# Patient Record
Sex: Female | Born: 1935 | Race: Black or African American | Hispanic: No
Health system: Southern US, Community
[De-identification: ages and names within clinical notes are randomized; demographics above are authoritative.]

---

## 2005-10-15 ENCOUNTER — Ambulatory Visit: Payer: Self-pay | Admitting: Family Medicine

## 2005-11-26 ENCOUNTER — Inpatient Hospital Stay: Payer: Self-pay | Admitting: General Practice

## 2005-11-27 ENCOUNTER — Other Ambulatory Visit: Payer: Self-pay

## 2005-11-29 ENCOUNTER — Other Ambulatory Visit: Payer: Self-pay

## 2006-05-01 ENCOUNTER — Encounter: Payer: Self-pay | Admitting: General Practice

## 2006-09-11 ENCOUNTER — Ambulatory Visit: Payer: Self-pay | Admitting: Ophthalmology

## 2006-09-18 ENCOUNTER — Ambulatory Visit: Payer: Self-pay | Admitting: Ophthalmology

## 2006-11-14 ENCOUNTER — Ambulatory Visit: Payer: Self-pay | Admitting: Family Medicine

## 2007-11-17 ENCOUNTER — Ambulatory Visit: Payer: Self-pay | Admitting: Family Medicine

## 2007-11-26 ENCOUNTER — Other Ambulatory Visit: Payer: Self-pay

## 2007-11-26 ENCOUNTER — Emergency Department: Payer: Self-pay | Admitting: Internal Medicine

## 2007-11-29 ENCOUNTER — Other Ambulatory Visit: Payer: Self-pay

## 2007-11-29 ENCOUNTER — Emergency Department: Payer: Self-pay | Admitting: Emergency Medicine

## 2008-02-02 ENCOUNTER — Emergency Department: Payer: Self-pay | Admitting: Emergency Medicine

## 2008-02-02 ENCOUNTER — Other Ambulatory Visit: Payer: Self-pay

## 2008-06-14 ENCOUNTER — Inpatient Hospital Stay: Payer: Self-pay | Admitting: Internal Medicine

## 2008-11-29 ENCOUNTER — Ambulatory Visit: Payer: Self-pay | Admitting: Family Medicine

## 2008-12-16 ENCOUNTER — Emergency Department: Payer: Self-pay | Admitting: Emergency Medicine

## 2008-12-18 ENCOUNTER — Inpatient Hospital Stay: Payer: Self-pay | Admitting: Internal Medicine

## 2009-02-17 ENCOUNTER — Ambulatory Visit: Payer: Self-pay | Admitting: Ophthalmology

## 2009-03-02 ENCOUNTER — Ambulatory Visit: Payer: Self-pay | Admitting: Ophthalmology

## 2009-12-01 ENCOUNTER — Ambulatory Visit: Payer: Self-pay | Admitting: Family Medicine

## 2010-04-23 ENCOUNTER — Ambulatory Visit: Payer: Self-pay | Admitting: Internal Medicine

## 2010-10-02 ENCOUNTER — Inpatient Hospital Stay: Payer: Self-pay | Admitting: Internal Medicine

## 2010-11-23 ENCOUNTER — Emergency Department: Payer: Self-pay | Admitting: Emergency Medicine

## 2010-12-14 ENCOUNTER — Ambulatory Visit: Payer: Self-pay | Admitting: Family Medicine

## 2011-08-09 ENCOUNTER — Inpatient Hospital Stay: Payer: Self-pay | Admitting: Internal Medicine

## 2011-08-09 LAB — COMPREHENSIVE METABOLIC PANEL
Alkaline Phosphatase: 490 U/L — ABNORMAL HIGH (ref 50–136)
Bilirubin,Total: 19.9 mg/dL — ABNORMAL HIGH (ref 0.2–1.0)
Calcium, Total: 8.6 mg/dL (ref 8.5–10.1)
Chloride: 101 mmol/L (ref 98–107)
Co2: 24 mmol/L (ref 21–32)
Creatinine: 0.69 mg/dL (ref 0.60–1.30)
EGFR (Non-African Amer.): >60
Osmolality: 275 (ref 275–301)
SGPT (ALT): 42 U/L
Sodium: 138 mmol/L (ref 136–145)

## 2011-08-09 LAB — URINALYSIS, COMPLETE
Blood: NEGATIVE
Glucose,UR: NEGATIVE mg/dL (ref 0–75)
Hyaline Cast: 3
Leukocyte Esterase: NEGATIVE
Ph: 5 (ref 4.5–8.0)
Specific Gravity: 1.005 (ref 1.003–1.030)
Squamous Epithelial: 1

## 2011-08-09 LAB — CBC WITH DIFFERENTIAL/PLATELET
Bands: 6 %
Basophil: 1 %
Eosinophil: 1 %
HCT: 27.2 % — ABNORMAL LOW (ref 35.0–47.0)
HGB: 9.1 g/dL — ABNORMAL LOW (ref 12.0–16.0)
MCH: 27 pg (ref 26.0–34.0)
MCHC: 33.4 g/dL (ref 32.0–36.0)
MCV: 81 fL (ref 80–100)
Platelet: 243 10*3/uL (ref 150–440)
RBC: 3.37 10*6/uL — ABNORMAL LOW (ref 3.80–5.20)

## 2011-08-09 LAB — LIPASE, BLOOD: Lipase: 98 U/L (ref 73–393)

## 2011-08-09 LAB — LACTATE DEHYDROGENASE: LDH: 302 U/L — ABNORMAL HIGH (ref 84–246)

## 2011-08-10 ENCOUNTER — Ambulatory Visit: Payer: Self-pay

## 2011-08-10 LAB — COMPREHENSIVE METABOLIC PANEL
Albumin: 3.1 g/dL — ABNORMAL LOW (ref 3.4–5.0)
Alkaline Phosphatase: 399 U/L — ABNORMAL HIGH (ref 50–136)
Anion Gap: 12 (ref 7–16)
Bilirubin,Total: 21.7 mg/dL — ABNORMAL HIGH (ref 0.2–1.0)
Co2: 26 mmol/L (ref 21–32)
Creatinine: 0.78 mg/dL (ref 0.60–1.30)
EGFR (African American): >60
Potassium: 3.3 mmol/L — ABNORMAL LOW (ref 3.5–5.1)
SGOT(AST): 46 U/L — ABNORMAL HIGH (ref 15–37)
SGPT (ALT): 36 U/L
Total Protein: 6.9 g/dL (ref 6.4–8.2)

## 2011-08-10 LAB — CBC WITH DIFFERENTIAL/PLATELET
Bands: 3 %
Bands: 1 %
Basophil: 1 %
Comment - H1-Com3: NORMAL
Eosinophil: 9 %
Eosinophil: 3 %
HGB: 8.1 g/dL — ABNORMAL LOW (ref 12.0–16.0)
HGB: 8.8 g/dL — ABNORMAL LOW (ref 12.0–16.0)
Lymphocytes: 11 %
MCH: 27.4 pg (ref 26.0–34.0)
MCH: 27.6 pg (ref 26.0–34.0)
MCHC: 33.9 g/dL (ref 32.0–36.0)
MCV: 82 fL (ref 80–100)
Monocytes: 12 %
Platelet: 211 10*3/uL (ref 150–440)
RBC: 2.97 10*6/uL — ABNORMAL LOW (ref 3.80–5.20)
RBC: 3.18 10*6/uL — ABNORMAL LOW (ref 3.80–5.20)
RDW: 21.2 % — ABNORMAL HIGH (ref 11.5–14.5)

## 2011-08-10 LAB — BASIC METABOLIC PANEL
Anion Gap: 15 (ref 7–16)
BUN: 18 mg/dL (ref 7–18)
Co2: 22 mmol/L (ref 21–32)
Creatinine: 0.77 mg/dL (ref 0.60–1.30)
EGFR (Non-African Amer.): >60
Glucose: 172 mg/dL — ABNORMAL HIGH (ref 65–99)
Osmolality: 284 (ref 275–301)
Potassium: 3.5 mmol/L (ref 3.5–5.1)
Sodium: 139 mmol/L (ref 136–145)

## 2011-08-10 LAB — APTT: Activated PTT: 38.5 secs — ABNORMAL HIGH (ref 23.6–35.9)

## 2011-08-10 LAB — FIBRINOGEN: Fibrinogen: 558 mg/dL — ABNORMAL HIGH (ref 210–470)

## 2011-08-10 LAB — PROTIME-INR: INR: 1.3

## 2011-08-11 LAB — CBC WITH DIFFERENTIAL/PLATELET
HCT: 25.8 % — ABNORMAL LOW (ref 35.0–47.0)
MCH: 27.2 pg (ref 26.0–34.0)
MCV: 82 fL (ref 80–100)
Platelet: 212 10*3/uL (ref 150–440)
RDW: 21 % — ABNORMAL HIGH (ref 11.5–14.5)
WBC: 10 10*3/uL (ref 3.6–11.0)

## 2011-08-11 LAB — PROTIME-INR
INR: 1.2
Prothrombin Time: 15.3 secs — ABNORMAL HIGH (ref 11.5–14.7)

## 2011-08-11 LAB — LIPASE, BLOOD: Lipase: 120 U/L (ref 73–393)

## 2011-08-11 LAB — BASIC METABOLIC PANEL
Calcium, Total: 8.7 mg/dL (ref 8.5–10.1)
Co2: 20 mmol/L — ABNORMAL LOW (ref 21–32)
Osmolality: 290 (ref 275–301)
Potassium: 3.8 mmol/L (ref 3.5–5.1)
Sodium: 141 mmol/L (ref 136–145)

## 2011-08-11 LAB — APTT: Activated PTT: 29.7 secs (ref 23.6–35.9)

## 2011-08-11 LAB — HEPATIC FUNCTION PANEL A (ARMC)
Bilirubin, Direct: 21.3 mg/dL — ABNORMAL HIGH (ref 0.00–0.20)
SGPT (ALT): 36 U/L

## 2011-08-12 LAB — COMPREHENSIVE METABOLIC PANEL
Anion Gap: 13 (ref 7–16)
BUN: 30 mg/dL — ABNORMAL HIGH (ref 7–18)
Bilirubin,Total: 27.6 mg/dL — ABNORMAL HIGH (ref 0.2–1.0)
Calcium, Total: 9.3 mg/dL (ref 8.5–10.1)
Chloride: 100 mmol/L (ref 98–107)
Co2: 24 mmol/L (ref 21–32)
Creatinine: 0.95 mg/dL (ref 0.60–1.30)
EGFR (African American): >60
EGFR (Non-African Amer.): >60
Osmolality: 298 (ref 275–301)
Potassium: 3.8 mmol/L (ref 3.5–5.1)
SGPT (ALT): 40 U/L
Total Protein: 7.1 g/dL (ref 6.4–8.2)

## 2011-08-12 LAB — CBC WITH DIFFERENTIAL/PLATELET
HCT: 26.3 % — ABNORMAL LOW (ref 35.0–47.0)
HGB: 8.9 g/dL — ABNORMAL LOW (ref 12.0–16.0)
Lymphocytes: 12 %
MCHC: 33.8 g/dL (ref 32.0–36.0)
MCV: 82 fL (ref 80–100)
Platelet: 239 10*3/uL (ref 150–440)
Segmented Neutrophils: 75 %
WBC: 9.2 10*3/uL (ref 3.6–11.0)

## 2011-08-12 LAB — PROTIME-INR: Prothrombin Time: 14.5 secs (ref 11.5–14.7)

## 2011-08-12 LAB — APTT: Activated PTT: 30.3 secs (ref 23.6–35.9)

## 2011-08-13 LAB — CBC WITH DIFFERENTIAL/PLATELET
HCT: 27.4 % — ABNORMAL LOW (ref 35.0–47.0)
Lymphocytes: 7 %
MCH: 27.6 pg (ref 26.0–34.0)
MCV: 83 fL (ref 80–100)
Monocytes: 13 %
Platelet: 253 10*3/uL (ref 150–440)
RDW: 21.6 % — ABNORMAL HIGH (ref 11.5–14.5)

## 2011-08-13 LAB — COMPREHENSIVE METABOLIC PANEL
Albumin: 2.9 g/dL — ABNORMAL LOW (ref 3.4–5.0)
Alkaline Phosphatase: 483 U/L — ABNORMAL HIGH (ref 50–136)
Bilirubin,Total: 28.2 mg/dL — ABNORMAL HIGH (ref 0.2–1.0)
Chloride: 100 mmol/L (ref 98–107)
Co2: 25 mmol/L (ref 21–32)
Creatinine: 0.71 mg/dL (ref 0.60–1.30)
EGFR (African American): >60
EGFR (Non-African Amer.): >60
Glucose: 282 mg/dL — ABNORMAL HIGH (ref 65–99)
Osmolality: 293 (ref 275–301)
SGOT(AST): 38 U/L — ABNORMAL HIGH (ref 15–37)
SGPT (ALT): 39 U/L
Sodium: 139 mmol/L (ref 136–145)

## 2011-08-14 LAB — CBC WITH DIFFERENTIAL/PLATELET
HCT: 27.1 % — ABNORMAL LOW (ref 35.0–47.0)
Lymphocytes: 5 %
MCH: 27.2 pg (ref 26.0–34.0)
MCV: 83 fL (ref 80–100)
Monocytes: 9 %
RBC: 3.27 10*6/uL — ABNORMAL LOW (ref 3.80–5.20)
WBC: 9.5 10*3/uL (ref 3.6–11.0)

## 2011-08-14 LAB — COMPREHENSIVE METABOLIC PANEL
Alkaline Phosphatase: 485 U/L — ABNORMAL HIGH (ref 50–136)
Anion Gap: 12 (ref 7–16)
Bilirubin,Total: 23.3 mg/dL — ABNORMAL HIGH (ref 0.2–1.0)
Co2: 28 mmol/L (ref 21–32)
Creatinine: 0.73 mg/dL (ref 0.60–1.30)
EGFR (African American): >60
EGFR (Non-African Amer.): >60
Glucose: 298 mg/dL — ABNORMAL HIGH (ref 65–99)
Potassium: 4.2 mmol/L (ref 3.5–5.1)
SGPT (ALT): 40 U/L
Sodium: 142 mmol/L (ref 136–145)

## 2011-08-14 LAB — LIPASE, BLOOD: Lipase: 92 U/L (ref 73–393)

## 2011-08-15 LAB — COMPREHENSIVE METABOLIC PANEL
Albumin: 2.7 g/dL — ABNORMAL LOW (ref 3.4–5.0)
Anion Gap: 11 (ref 7–16)
BUN: 18 mg/dL (ref 7–18)
Bilirubin,Total: 14.4 mg/dL — ABNORMAL HIGH (ref 0.2–1.0)
Calcium, Total: 8.8 mg/dL (ref 8.5–10.1)
Chloride: 101 mmol/L (ref 98–107)
Co2: 28 mmol/L (ref 21–32)
EGFR (African American): >60
EGFR (Non-African Amer.): >60
Potassium: 3.8 mmol/L (ref 3.5–5.1)
SGOT(AST): 19 U/L (ref 15–37)
SGPT (ALT): 35 U/L
Total Protein: 6.6 g/dL (ref 6.4–8.2)

## 2011-08-16 LAB — HEPATIC FUNCTION PANEL A (ARMC)
Albumin: 2.5 g/dL — ABNORMAL LOW (ref 3.4–5.0)
Alkaline Phosphatase: 324 U/L — ABNORMAL HIGH (ref 50–136)
Bilirubin, Direct: 8.5 mg/dL — ABNORMAL HIGH (ref 0.00–0.20)
Bilirubin,Total: 10.2 mg/dL — ABNORMAL HIGH (ref 0.2–1.0)

## 2011-08-20 ENCOUNTER — Telehealth: Payer: Self-pay

## 2011-08-20 DIAGNOSIS — K831 Obstruction of bile duct: Secondary | ICD-10-CM

## 2011-08-20 NOTE — Telephone Encounter (Signed)
I spoke with the pt who was having a hard time understanding the instructions and she asked me to speak with her niece.  I went over the procedure and the instructions with her niece and reviewed her medications.  I also mailed the instructions and she will call with any questions or concerns.

## 2011-08-23 ENCOUNTER — Telehealth: Payer: Self-pay

## 2011-08-23 NOTE — Telephone Encounter (Signed)
Response received from Rudi Coco NP that the pt has been off of her coumadin since 08/16/11.  The pt has opted to remain off of the coumadin despite the stroke risk.  Information will be scanned into EPIC.

## 2011-09-06 ENCOUNTER — Ambulatory Visit: Payer: Self-pay

## 2011-09-06 ENCOUNTER — Encounter: Payer: Self-pay | Admitting: Gastroenterology

## 2011-09-10 ENCOUNTER — Telehealth: Payer: Self-pay | Admitting: *Deleted

## 2011-09-10 ENCOUNTER — Telehealth: Payer: Self-pay | Admitting: Gastroenterology

## 2011-09-10 LAB — PATHOLOGY REPORT

## 2011-09-10 NOTE — Telephone Encounter (Signed)
Dr Jennifer Egan Pathologist with [facility] called to give results of EUS: Pancreatic Head Mass with Pancreatic Ductal Adenocarcinoma with insufficient material in cell block for further studies.  

## 2011-09-10 NOTE — Telephone Encounter (Signed)
Pt's niece was informed that the results are not available and we will call as soon as the results are reviewed

## 2011-09-11 NOTE — Telephone Encounter (Signed)
I spoke with Dr Brodie she reviewed the path results and feels like it is best to let Dr Jacobs take care of the pt on Monday when he is back in town.  I will put the results on Dr jacobs desk. 

## 2011-09-11 NOTE — Telephone Encounter (Signed)
Pathology results received I will give to Dr Juanda Chance to review (doc of the day)

## 2011-09-14 NOTE — Telephone Encounter (Signed)
On your desk; Patty got them. 

## 2011-09-14 NOTE — Telephone Encounter (Signed)
Can you make sure that [facility] sends a hard copy of the final path to me and put on my desk.  Thanks.

## 2011-09-17 ENCOUNTER — Telehealth: Payer: Self-pay | Admitting: Gastroenterology

## 2011-09-17 NOTE — Telephone Encounter (Signed)
i spoke with pt and her niece Britta Mccreedy this AM.  She is aware of biopsy results and has appt at [facility] this Thursday already set up.

## 2011-09-19 ENCOUNTER — Encounter: Payer: Self-pay | Admitting: Gastroenterology

## 2011-09-20 ENCOUNTER — Ambulatory Visit: Payer: Self-pay | Admitting: Oncology

## 2011-09-20 ENCOUNTER — Inpatient Hospital Stay: Payer: Self-pay | Admitting: Oncology

## 2011-09-20 LAB — CBC WITH DIFFERENTIAL/PLATELET
Basophil #: 0 10*3/uL (ref 0.0–0.1)
Eosinophil #: 0.1 10*3/uL (ref 0.0–0.7)
Eosinophil %: 1 %
HCT: 27.9 % — ABNORMAL LOW (ref 35.0–47.0)
HGB: 8.6 g/dL — ABNORMAL LOW (ref 12.0–16.0)
Lymphocyte #: 0.2 10*3/uL — ABNORMAL LOW (ref 1.0–3.6)
Monocyte #: 1.8 x10 3/mm — ABNORMAL HIGH (ref 0.2–0.9)
Neutrophil #: 8 10*3/uL — ABNORMAL HIGH (ref 1.4–6.5)
Neutrophil %: 78.3 %
Platelet: 286 10*3/uL (ref 150–440)
RBC: 3.28 10*6/uL — ABNORMAL LOW (ref 3.80–5.20)
RDW: 20.2 % — ABNORMAL HIGH (ref 11.5–14.5)
WBC: 10.2 10*3/uL (ref 3.6–11.0)

## 2011-09-20 LAB — COMPREHENSIVE METABOLIC PANEL
Albumin: 3.1 g/dL — ABNORMAL LOW (ref 3.4–5.0)
Anion Gap: 8 (ref 7–16)
BUN: 14 mg/dL (ref 7–18)
Bilirubin,Total: 6.5 mg/dL — ABNORMAL HIGH (ref 0.2–1.0)
Calcium, Total: 9 mg/dL (ref 8.5–10.1)
Chloride: 96 mmol/L — ABNORMAL LOW (ref 98–107)
Creatinine: 0.94 mg/dL (ref 0.60–1.30)
EGFR (African American): >60
EGFR (Non-African Amer.): 59 — ABNORMAL LOW
Glucose: 254 mg/dL — ABNORMAL HIGH (ref 65–99)
Osmolality: 274 (ref 275–301)
Potassium: 4.1 mmol/L (ref 3.5–5.1)
SGOT(AST): 52 U/L — ABNORMAL HIGH (ref 15–37)
SGPT (ALT): 38 U/L
Total Protein: 7.4 g/dL (ref 6.4–8.2)

## 2011-09-21 LAB — URINALYSIS, COMPLETE
Glucose,UR: NEGATIVE mg/dL (ref 0–75)
Ketone: NEGATIVE
Specific Gravity: 1.013 (ref 1.003–1.030)
WBC UR: 3746 /HPF (ref 0–5)

## 2011-09-22 LAB — COMPREHENSIVE METABOLIC PANEL
Anion Gap: 12 (ref 7–16)
Bilirubin,Total: 9.8 mg/dL — ABNORMAL HIGH (ref 0.2–1.0)
Calcium, Total: 8.6 mg/dL (ref 8.5–10.1)
Chloride: 98 mmol/L (ref 98–107)
EGFR (African American): >60
EGFR (Non-African Amer.): >60
Glucose: 240 mg/dL — ABNORMAL HIGH (ref 65–99)
Potassium: 3.9 mmol/L (ref 3.5–5.1)
SGOT(AST): 46 U/L — ABNORMAL HIGH (ref 15–37)
SGPT (ALT): 42 U/L
Sodium: 134 mmol/L — ABNORMAL LOW (ref 136–145)
Total Protein: 7 g/dL (ref 6.4–8.2)

## 2011-09-22 LAB — CBC WITH DIFFERENTIAL/PLATELET
Basophil #: 0 10*3/uL (ref 0.0–0.1)
Eosinophil %: 0.7 %
HCT: 26.7 % — ABNORMAL LOW (ref 35.0–47.0)
HGB: 8.6 g/dL — ABNORMAL LOW (ref 12.0–16.0)
Lymphocyte %: 3.7 %
MCH: 27 pg (ref 26.0–34.0)
MCHC: 32.1 g/dL (ref 32.0–36.0)
Monocyte #: 1 x10 3/mm — ABNORMAL HIGH (ref 0.2–0.9)
Monocyte %: 11.9 %
Neutrophil #: 6.8 10*3/uL — ABNORMAL HIGH (ref 1.4–6.5)
Neutrophil %: 83.3 %
Platelet: 292 10*3/uL (ref 150–440)
RDW: 19.8 % — ABNORMAL HIGH (ref 11.5–14.5)
WBC: 8.2 10*3/uL (ref 3.6–11.0)

## 2011-09-23 LAB — HEPATIC FUNCTION PANEL A (ARMC)
Albumin: 2.6 g/dL — ABNORMAL LOW (ref 3.4–5.0)
Bilirubin,Total: 12.2 mg/dL — ABNORMAL HIGH (ref 0.2–1.0)
SGOT(AST): 37 U/L (ref 15–37)
SGPT (ALT): 42 U/L
Total Protein: 7.3 g/dL (ref 6.4–8.2)

## 2011-09-24 LAB — POTASSIUM: Potassium: 4.2 mmol/L (ref 3.5–5.1)

## 2011-09-24 LAB — CBC WITH DIFFERENTIAL/PLATELET
Basophil: 1 %
HCT: 26 % — ABNORMAL LOW (ref 35.0–47.0)
Lymphocytes: 10 %
MCHC: 31.2 g/dL — ABNORMAL LOW (ref 32.0–36.0)
Monocytes: 7 %
Platelet: 274 10*3/uL (ref 150–440)
RBC: 3.1 10*6/uL — ABNORMAL LOW (ref 3.80–5.20)
Segmented Neutrophils: 75 %

## 2011-09-24 LAB — CREATININE, SERUM
Creatinine: 0.75 mg/dL (ref 0.60–1.30)
EGFR (Non-African Amer.): >60

## 2011-09-25 LAB — COMPREHENSIVE METABOLIC PANEL
Albumin: 2.3 g/dL — ABNORMAL LOW (ref 3.4–5.0)
Alkaline Phosphatase: 491 U/L — ABNORMAL HIGH (ref 50–136)
Anion Gap: 11 (ref 7–16)
BUN: 19 mg/dL — ABNORMAL HIGH (ref 7–18)
EGFR (African American): >60
EGFR (Non-African Amer.): >60
Osmolality: 276 (ref 275–301)
Sodium: 132 mmol/L — ABNORMAL LOW (ref 136–145)
Total Protein: 6.7 g/dL (ref 6.4–8.2)

## 2011-09-25 LAB — CBC WITH DIFFERENTIAL/PLATELET
Bands: 2 %
Comment - H1-Com3: NORMAL
Lymphocytes: 11 %
MCV: 84 fL (ref 80–100)
Monocytes: 6 %
Platelet: 267 10*3/uL (ref 150–440)
RBC: 3.04 10*6/uL — ABNORMAL LOW (ref 3.80–5.20)
RDW: 19.4 % — ABNORMAL HIGH (ref 11.5–14.5)
Segmented Neutrophils: 77 %

## 2011-09-26 LAB — COMPREHENSIVE METABOLIC PANEL
Anion Gap: 12 (ref 7–16)
BUN: 26 mg/dL — ABNORMAL HIGH (ref 7–18)
Bilirubin,Total: 5.8 mg/dL — ABNORMAL HIGH (ref 0.2–1.0)
Calcium, Total: 8.4 mg/dL — ABNORMAL LOW (ref 8.5–10.1)
Creatinine: 1.23 mg/dL (ref 0.60–1.30)
EGFR (African American): 50 — ABNORMAL LOW
EGFR (Non-African Amer.): 43 — ABNORMAL LOW
Glucose: 270 mg/dL — ABNORMAL HIGH (ref 65–99)
SGPT (ALT): 26 U/L
Total Protein: 6.8 g/dL (ref 6.4–8.2)

## 2011-09-26 LAB — CULTURE, BLOOD (SINGLE)

## 2011-09-26 LAB — CBC WITH DIFFERENTIAL/PLATELET
Basophil #: 0.2 10*3/uL — ABNORMAL HIGH (ref 0.0–0.1)
Basophil %: 1.7 %
Eosinophil #: 0.5 10*3/uL (ref 0.0–0.7)
HCT: 25.5 % — ABNORMAL LOW (ref 35.0–47.0)
HGB: 8 g/dL — ABNORMAL LOW (ref 12.0–16.0)
Lymphocyte %: 8.6 %
Monocyte #: 1.5 x10 3/mm — ABNORMAL HIGH (ref 0.2–0.9)
Monocyte %: 15 %
Neutrophil #: 6.8 10*3/uL — ABNORMAL HIGH (ref 1.4–6.5)
RBC: 3.04 10*6/uL — ABNORMAL LOW (ref 3.80–5.20)
WBC: 9.8 10*3/uL (ref 3.6–11.0)

## 2011-10-08 ENCOUNTER — Ambulatory Visit: Payer: Self-pay | Admitting: Vascular Surgery

## 2011-10-10 ENCOUNTER — Ambulatory Visit: Payer: Self-pay | Admitting: Oncology

## 2011-10-22 LAB — CBC CANCER CENTER
Basophil %: 1.2 %
Eosinophil %: 5.1 %
HCT: 28.6 % — ABNORMAL LOW (ref 35.0–47.0)
HGB: 8.9 g/dL — ABNORMAL LOW (ref 12.0–16.0)
Lymphocyte %: 11 %
MCH: 26.2 pg (ref 26.0–34.0)
MCHC: 31.3 g/dL — ABNORMAL LOW (ref 32.0–36.0)
MCV: 84 fL (ref 80–100)
Monocyte #: 0.8 x10 3/mm (ref 0.2–0.9)
Neutrophil #: 4.8 x10 3/mm (ref 1.4–6.5)
Neutrophil %: 70.4 %
Platelet: 236 x10 3/mm (ref 150–440)
RBC: 3.4 10*6/uL — ABNORMAL LOW (ref 3.80–5.20)
RDW: 19.5 % — ABNORMAL HIGH (ref 11.5–14.5)
WBC: 6.9 x10 3/mm (ref 3.6–11.0)

## 2011-10-22 LAB — COMPREHENSIVE METABOLIC PANEL
Alkaline Phosphatase: 257 U/L — ABNORMAL HIGH (ref 50–136)
BUN: 7 mg/dL (ref 7–18)
Bilirubin,Total: 2.2 mg/dL — ABNORMAL HIGH (ref 0.2–1.0)
Chloride: 99 mmol/L (ref 98–107)
Co2: 30 mmol/L (ref 21–32)
Creatinine: 0.81 mg/dL (ref 0.60–1.30)
EGFR (African American): >60
EGFR (Non-African Amer.): >60
Osmolality: 274 (ref 275–301)
Sodium: 138 mmol/L (ref 136–145)

## 2011-10-29 LAB — CBC CANCER CENTER
Basophil #: 0.1 x10 3/mm (ref 0.0–0.1)
Basophil %: 1 %
Eosinophil %: 5.7 %
Lymphocyte #: 0.6 x10 3/mm — ABNORMAL LOW (ref 1.0–3.6)
MCH: 25.9 pg — ABNORMAL LOW (ref 26.0–34.0)
MCHC: 31 g/dL — ABNORMAL LOW (ref 32.0–36.0)
Monocyte #: 0.7 x10 3/mm (ref 0.2–0.9)
Neutrophil %: 69.8 %
Platelet: 251 x10 3/mm (ref 150–440)
RBC: 3.34 10*6/uL — ABNORMAL LOW (ref 3.80–5.20)
RDW: 19.5 % — ABNORMAL HIGH (ref 11.5–14.5)
WBC: 5.5 x10 3/mm (ref 3.6–11.0)

## 2011-10-29 LAB — IRON AND TIBC
Iron Saturation: 24 %
Iron: 73 ug/dL (ref 50–170)

## 2011-10-29 LAB — HEPATIC FUNCTION PANEL A (ARMC)
Alkaline Phosphatase: 205 U/L — ABNORMAL HIGH (ref 50–136)
Bilirubin, Direct: 1.5 mg/dL — ABNORMAL HIGH (ref 0.00–0.20)
Bilirubin,Total: 1.8 mg/dL — ABNORMAL HIGH (ref 0.2–1.0)
Total Protein: 7.4 g/dL (ref 6.4–8.2)

## 2011-10-29 LAB — BASIC METABOLIC PANEL
Anion Gap: 11 (ref 7–16)
Chloride: 101 mmol/L (ref 98–107)
Glucose: 115 mg/dL — ABNORMAL HIGH (ref 65–99)
Osmolality: 280 (ref 275–301)
Sodium: 140 mmol/L (ref 136–145)

## 2011-11-06 LAB — HEPATIC FUNCTION PANEL A (ARMC)
Albumin: 3.2 g/dL — ABNORMAL LOW (ref 3.4–5.0)
Alkaline Phosphatase: 183 U/L — ABNORMAL HIGH (ref 50–136)
Bilirubin, Direct: 1.2 mg/dL — ABNORMAL HIGH (ref 0.00–0.20)
Bilirubin,Total: 1.6 mg/dL — ABNORMAL HIGH (ref 0.2–1.0)
SGOT(AST): 14 U/L — ABNORMAL LOW (ref 15–37)

## 2011-11-06 LAB — CBC CANCER CENTER
Basophil %: 0.9 %
Eosinophil #: 0.3 x10 3/mm (ref 0.0–0.7)
Eosinophil %: 5.3 %
HCT: 27.4 % — ABNORMAL LOW (ref 35.0–47.0)
Lymphocyte %: 7.3 %
MCH: 26.1 pg (ref 26.0–34.0)
MCHC: 31.2 g/dL — ABNORMAL LOW (ref 32.0–36.0)
MCV: 84 fL (ref 80–100)
Monocyte #: 0.6 x10 3/mm (ref 0.2–0.9)
Monocyte %: 12 %
Neutrophil %: 74.5 %
RBC: 3.27 10*6/uL — ABNORMAL LOW (ref 3.80–5.20)
WBC: 5 x10 3/mm (ref 3.6–11.0)

## 2011-11-06 LAB — BASIC METABOLIC PANEL
Anion Gap: 12 (ref 7–16)
BUN: 9 mg/dL (ref 7–18)
Calcium, Total: 8.5 mg/dL (ref 8.5–10.1)
Co2: 26 mmol/L (ref 21–32)
Creatinine: 0.72 mg/dL (ref 0.60–1.30)
EGFR (Non-African Amer.): >60
Glucose: 217 mg/dL — ABNORMAL HIGH (ref 65–99)
Osmolality: 279 (ref 275–301)
Potassium: 3.7 mmol/L (ref 3.5–5.1)
Sodium: 137 mmol/L (ref 136–145)

## 2011-11-10 ENCOUNTER — Ambulatory Visit: Payer: Self-pay | Admitting: Oncology

## 2011-11-12 LAB — HEPATIC FUNCTION PANEL A (ARMC)
Albumin: 3.3 g/dL — ABNORMAL LOW (ref 3.4–5.0)
Bilirubin, Direct: 1 mg/dL — ABNORMAL HIGH (ref 0.00–0.20)
SGOT(AST): 16 U/L (ref 15–37)
SGPT (ALT): 15 U/L
Total Protein: 7.3 g/dL (ref 6.4–8.2)

## 2011-11-12 LAB — BASIC METABOLIC PANEL
Anion Gap: 8 (ref 7–16)
Calcium, Total: 8.6 mg/dL (ref 8.5–10.1)
Chloride: 99 mmol/L (ref 98–107)
Creatinine: 0.66 mg/dL (ref 0.60–1.30)
EGFR (African American): >60
Glucose: 136 mg/dL — ABNORMAL HIGH (ref 65–99)
Osmolality: 273 (ref 275–301)
Potassium: 3.6 mmol/L (ref 3.5–5.1)
Sodium: 136 mmol/L (ref 136–145)

## 2011-11-12 LAB — CBC CANCER CENTER
Eosinophil #: 0.2 x10 3/mm (ref 0.0–0.7)
Lymphocyte #: 0.6 x10 3/mm — ABNORMAL LOW (ref 1.0–3.6)
MCH: 26.3 pg (ref 26.0–34.0)
MCHC: 31.6 g/dL — ABNORMAL LOW (ref 32.0–36.0)
Monocyte #: 0.9 x10 3/mm (ref 0.2–0.9)
Neutrophil #: 2.6 x10 3/mm (ref 1.4–6.5)
Neutrophil %: 58.5 %
Platelet: 176 x10 3/mm (ref 150–440)
RDW: 19.9 % — ABNORMAL HIGH (ref 11.5–14.5)
WBC: 4.4 x10 3/mm (ref 3.6–11.0)

## 2011-11-19 LAB — BASIC METABOLIC PANEL
Anion Gap: 13 (ref 7–16)
BUN: 9 mg/dL (ref 7–18)
Calcium, Total: 8.4 mg/dL — ABNORMAL LOW (ref 8.5–10.1)
Chloride: 97 mmol/L — ABNORMAL LOW (ref 98–107)
Creatinine: 0.71 mg/dL (ref 0.60–1.30)
EGFR (African American): >60
EGFR (Non-African Amer.): >60
Glucose: 229 mg/dL — ABNORMAL HIGH (ref 65–99)
Osmolality: 283 (ref 275–301)
Sodium: 139 mmol/L (ref 136–145)

## 2011-11-19 LAB — CBC CANCER CENTER
Basophil %: 1.1 %
Eosinophil #: 0.3 x10 3/mm (ref 0.0–0.7)
Eosinophil %: 5.4 %
HCT: 26.4 % — ABNORMAL LOW (ref 35.0–47.0)
Lymphocyte #: 0.5 x10 3/mm — ABNORMAL LOW (ref 1.0–3.6)
Lymphocyte %: 10.1 %
MCH: 26.3 pg (ref 26.0–34.0)
Monocyte #: 0.9 x10 3/mm (ref 0.2–0.9)
Monocyte %: 19 %
Neutrophil #: 3.1 x10 3/mm (ref 1.4–6.5)
Neutrophil %: 64.4 %
RBC: 3.16 10*6/uL — ABNORMAL LOW (ref 3.80–5.20)

## 2011-11-19 LAB — HEPATIC FUNCTION PANEL A (ARMC)
Albumin: 3.2 g/dL — ABNORMAL LOW (ref 3.4–5.0)
Bilirubin, Direct: 1 mg/dL — ABNORMAL HIGH (ref 0.00–0.20)
SGOT(AST): 22 U/L (ref 15–37)
SGPT (ALT): 39 U/L

## 2011-11-26 LAB — CBC CANCER CENTER
Basophil %: 0.2 %
Eosinophil #: 0 x10 3/mm (ref 0.0–0.7)
MCH: 26.6 pg (ref 26.0–34.0)
Monocyte %: 15.4 %
Neutrophil #: 6.3 x10 3/mm (ref 1.4–6.5)
Neutrophil %: 80.3 %
Platelet: 220 x10 3/mm (ref 150–440)
RBC: 3.38 10*6/uL — ABNORMAL LOW (ref 3.80–5.20)
RDW: 21.3 % — ABNORMAL HIGH (ref 11.5–14.5)
WBC: 7.9 x10 3/mm (ref 3.6–11.0)

## 2011-12-03 LAB — COMPREHENSIVE METABOLIC PANEL
Anion Gap: 6 — ABNORMAL LOW (ref 7–16)
BUN: 11 mg/dL (ref 7–18)
Bilirubin,Total: 2.3 mg/dL — ABNORMAL HIGH (ref 0.2–1.0)
Calcium, Total: 8.6 mg/dL (ref 8.5–10.1)
Chloride: 96 mmol/L — ABNORMAL LOW (ref 98–107)
EGFR (African American): >60
EGFR (Non-African Amer.): >60
Osmolality: 273 (ref 275–301)
Potassium: 3.9 mmol/L (ref 3.5–5.1)
SGOT(AST): 24 U/L (ref 15–37)
Sodium: 132 mmol/L — ABNORMAL LOW (ref 136–145)

## 2011-12-03 LAB — CBC CANCER CENTER
Basophil #: 0 x10 3/mm (ref 0.0–0.1)
Basophil %: 0.3 %
Eosinophil #: 0.2 x10 3/mm (ref 0.0–0.7)
HCT: 24.8 % — ABNORMAL LOW (ref 35.0–47.0)
MCH: 26.6 pg (ref 26.0–34.0)
MCV: 84 fL (ref 80–100)
Monocyte #: 0.9 x10 3/mm (ref 0.2–0.9)
Monocyte %: 14.7 %
Neutrophil %: 78.5 %
Platelet: 228 x10 3/mm (ref 150–440)
RBC: 2.94 10*6/uL — ABNORMAL LOW (ref 3.80–5.20)

## 2011-12-10 ENCOUNTER — Ambulatory Visit: Payer: Self-pay | Admitting: Oncology

## 2011-12-26 ENCOUNTER — Ambulatory Visit: Payer: Self-pay | Admitting: Gastroenterology

## 2012-01-07 LAB — CREATININE, SERUM
Creatinine: 0.84 mg/dL (ref 0.60–1.30)
EGFR (Non-African Amer.): >60

## 2012-01-09 LAB — COMPREHENSIVE METABOLIC PANEL
Albumin: 2.5 g/dL — ABNORMAL LOW (ref 3.4–5.0)
Alkaline Phosphatase: 432 U/L — ABNORMAL HIGH (ref 50–136)
BUN: 16 mg/dL (ref 7–18)
Calcium, Total: 8.5 mg/dL (ref 8.5–10.1)
Co2: 28 mmol/L (ref 21–32)
Glucose: 363 mg/dL — ABNORMAL HIGH (ref 65–99)
Osmolality: 271 (ref 275–301)
Potassium: 4.5 mmol/L (ref 3.5–5.1)
SGOT(AST): 28 U/L (ref 15–37)
SGPT (ALT): 36 U/L
Total Protein: 7.8 g/dL (ref 6.4–8.2)

## 2012-01-09 LAB — CBC CANCER CENTER
Basophil #: 0.1 x10 3/mm (ref 0.0–0.1)
Eosinophil #: 0.2 x10 3/mm (ref 0.0–0.7)
Eosinophil %: 2.5 %
HCT: 27.3 % — ABNORMAL LOW (ref 35.0–47.0)
HGB: 8.9 g/dL — ABNORMAL LOW (ref 12.0–16.0)
Lymphocyte #: 0.3 x10 3/mm — ABNORMAL LOW (ref 1.0–3.6)
MCV: 88 fL (ref 80–100)
Monocyte #: 1 x10 3/mm — ABNORMAL HIGH (ref 0.2–0.9)
Monocyte %: 11.9 %
Platelet: 330 x10 3/mm (ref 150–440)
RBC: 3.11 10*6/uL — ABNORMAL LOW (ref 3.80–5.20)
WBC: 8.3 x10 3/mm (ref 3.6–11.0)

## 2012-01-10 ENCOUNTER — Ambulatory Visit: Payer: Self-pay | Admitting: Oncology

## 2012-01-10 LAB — CANCER ANTIGEN 19-9: CA 19-9: 279 U/mL — ABNORMAL HIGH (ref 0–35)

## 2012-02-20 ENCOUNTER — Ambulatory Visit: Payer: Self-pay | Admitting: Oncology

## 2012-02-20 LAB — COMPREHENSIVE METABOLIC PANEL
Alkaline Phosphatase: 208 U/L — ABNORMAL HIGH (ref 50–136)
Anion Gap: 8 (ref 7–16)
Calcium, Total: 9 mg/dL (ref 8.5–10.1)
Chloride: 96 mmol/L — ABNORMAL LOW (ref 98–107)
Co2: 28 mmol/L (ref 21–32)
EGFR (African American): >60
Potassium: 4.8 mmol/L (ref 3.5–5.1)
SGOT(AST): 14 U/L — ABNORMAL LOW (ref 15–37)
Total Protein: 7.7 g/dL (ref 6.4–8.2)

## 2012-02-20 LAB — CBC CANCER CENTER
Basophil #: 0 x10 3/mm (ref 0.0–0.1)
HCT: 29.8 % — ABNORMAL LOW (ref 35.0–47.0)
Lymphocyte %: 9.2 %
MCH: 27.1 pg (ref 26.0–34.0)
MCHC: 31 g/dL — ABNORMAL LOW (ref 32.0–36.0)
MCV: 88 fL (ref 80–100)
Monocyte #: 0.8 x10 3/mm (ref 0.2–0.9)
Neutrophil #: 3 x10 3/mm (ref 1.4–6.5)
Neutrophil %: 67.7 %
RBC: 3.4 10*6/uL — ABNORMAL LOW (ref 3.80–5.20)
RDW: 18.6 % — ABNORMAL HIGH (ref 11.5–14.5)

## 2012-02-20 LAB — IRON AND TIBC
Iron Bind.Cap.(Total): 240 ug/dL — ABNORMAL LOW (ref 250–450)
Iron: 72 ug/dL (ref 50–170)
Unbound Iron-Bind.Cap.: 168 ug/dL

## 2012-02-20 LAB — FERRITIN: Ferritin (ARMC): 235 ng/mL (ref 8–388)

## 2012-03-11 ENCOUNTER — Ambulatory Visit: Payer: Self-pay | Admitting: Oncology

## 2012-04-21 ENCOUNTER — Ambulatory Visit: Payer: Self-pay | Admitting: Oncology

## 2012-05-11 ENCOUNTER — Ambulatory Visit: Payer: Self-pay | Admitting: Oncology

## 2012-06-16 ENCOUNTER — Ambulatory Visit: Payer: Self-pay | Admitting: Oncology

## 2012-06-16 LAB — CREATININE, SERUM
Creatinine: 0.85 mg/dL (ref 0.60–1.30)
EGFR (African American): >60
EGFR (Non-African Amer.): >60

## 2012-06-18 ENCOUNTER — Ambulatory Visit: Payer: Self-pay | Admitting: Oncology

## 2012-06-18 LAB — CBC CANCER CENTER
Basophil #: 0 x10 3/mm (ref 0.0–0.1)
Eosinophil %: 2.1 %
HCT: 28.5 % — ABNORMAL LOW (ref 35.0–47.0)
HGB: 9.5 g/dL — ABNORMAL LOW (ref 12.0–16.0)
Lymphocyte #: 0.5 x10 3/mm — ABNORMAL LOW (ref 1.0–3.6)
MCH: 29.1 pg (ref 26.0–34.0)
MCHC: 33.3 g/dL (ref 32.0–36.0)
MCV: 88 fL (ref 80–100)
Monocyte #: 1.2 x10 3/mm — ABNORMAL HIGH (ref 0.2–0.9)
Neutrophil %: 74.7 %
WBC: 7.5 x10 3/mm (ref 3.6–11.0)

## 2012-06-18 LAB — COMPREHENSIVE METABOLIC PANEL
Anion Gap: 8 (ref 7–16)
BUN: 7 mg/dL (ref 7–18)
Calcium, Total: 8.1 mg/dL — ABNORMAL LOW (ref 8.5–10.1)
Co2: 31 mmol/L (ref 21–32)
Creatinine: 0.85 mg/dL (ref 0.60–1.30)
EGFR (African American): >60
Glucose: 284 mg/dL — ABNORMAL HIGH (ref 65–99)
Osmolality: 277 (ref 275–301)
Potassium: 3.6 mmol/L (ref 3.5–5.1)
SGPT (ALT): 19 U/L (ref 12–78)
Total Protein: 6.9 g/dL (ref 6.4–8.2)

## 2012-06-26 LAB — CBC CANCER CENTER
Basophil #: 0 x10 3/mm (ref 0.0–0.1)
Eosinophil #: 0.1 x10 3/mm (ref 0.0–0.7)
Eosinophil %: 1.4 %
HGB: 8.9 g/dL — ABNORMAL LOW (ref 12.0–16.0)
Lymphocyte #: 0.4 x10 3/mm — ABNORMAL LOW (ref 1.0–3.6)
Lymphocyte %: 4.9 %
MCHC: 33.2 g/dL (ref 32.0–36.0)
MCV: 85 fL (ref 80–100)
Monocyte %: 14.5 %
Neutrophil #: 6.6 x10 3/mm — ABNORMAL HIGH (ref 1.4–6.5)
Neutrophil %: 78.8 %
Platelet: 363 x10 3/mm (ref 150–440)
RBC: 3.14 10*6/uL — ABNORMAL LOW (ref 3.80–5.20)
RDW: 15.4 % — ABNORMAL HIGH (ref 11.5–14.5)

## 2012-06-27 LAB — CANCER ANTIGEN 19-9: CA 19-9: 12373 U/mL — ABNORMAL HIGH (ref 0–35)

## 2012-07-04 LAB — CBC CANCER CENTER
Eosinophil %: 0.8 %
HCT: 27.1 % — ABNORMAL LOW (ref 35.0–47.0)
HGB: 8.7 g/dL — ABNORMAL LOW (ref 12.0–16.0)
Lymphocyte #: 0.3 x10 3/mm — ABNORMAL LOW (ref 1.0–3.6)
Lymphocyte %: 3.5 %
MCHC: 32.2 g/dL (ref 32.0–36.0)
MCV: 85 fL (ref 80–100)
Monocyte %: 16.1 %
Neutrophil %: 79.4 %
Platelet: 178 x10 3/mm (ref 150–440)
RDW: 15.5 % — ABNORMAL HIGH (ref 11.5–14.5)
WBC: 8.1 x10 3/mm (ref 3.6–11.0)

## 2012-07-06 ENCOUNTER — Inpatient Hospital Stay: Payer: Self-pay | Admitting: Internal Medicine

## 2012-07-06 LAB — CBC WITH DIFFERENTIAL/PLATELET
Basophil %: 0.3 %
Eosinophil %: 0.3 %
HGB: 8.8 g/dL — ABNORMAL LOW (ref 12.0–16.0)
Lymphocyte #: 0.2 10*3/uL — ABNORMAL LOW (ref 1.0–3.6)
Lymphocyte %: 1.3 %
MCHC: 30.5 g/dL — ABNORMAL LOW (ref 32.0–36.0)
Monocyte #: 0.2 x10 3/mm (ref 0.2–0.9)
Neutrophil #: 11.4 10*3/uL — ABNORMAL HIGH (ref 1.4–6.5)
Neutrophil %: 96.5 %
RBC: 3.31 10*6/uL — ABNORMAL LOW (ref 3.80–5.20)
RDW: 16 % — ABNORMAL HIGH (ref 11.5–14.5)
WBC: 11.9 10*3/uL — ABNORMAL HIGH (ref 3.6–11.0)

## 2012-07-06 LAB — URINALYSIS, COMPLETE
Bilirubin,UR: NEGATIVE
Blood: NEGATIVE
Glucose,UR: 150 mg/dL (ref 0–75)
Protein: NEGATIVE
RBC,UR: 2 /HPF (ref 0–5)
WBC UR: 7 /HPF (ref 0–5)

## 2012-07-06 LAB — COMPREHENSIVE METABOLIC PANEL
Albumin: 2.5 g/dL — ABNORMAL LOW (ref 3.4–5.0)
Anion Gap: 8 (ref 7–16)
BUN: 26 mg/dL — ABNORMAL HIGH (ref 7–18)
Bilirubin,Total: 1.3 mg/dL — ABNORMAL HIGH (ref 0.2–1.0)
Co2: 28 mmol/L (ref 21–32)
Creatinine: 1.04 mg/dL (ref 0.60–1.30)
EGFR (African American): >60
EGFR (Non-African Amer.): 52 — ABNORMAL LOW
Potassium: 4.2 mmol/L (ref 3.5–5.1)
SGOT(AST): 57 U/L — ABNORMAL HIGH (ref 15–37)
SGPT (ALT): 49 U/L (ref 12–78)

## 2012-07-06 LAB — CK TOTAL AND CKMB (NOT AT ARMC)
CK, Total: 45 U/L (ref 21–215)
CK-MB: <0.5 ng/mL — ABNORMAL LOW (ref 0.5–3.6)

## 2012-07-07 LAB — COMPREHENSIVE METABOLIC PANEL
Bilirubin,Total: 1.1 mg/dL — ABNORMAL HIGH (ref 0.2–1.0)
Calcium, Total: 8 mg/dL — ABNORMAL LOW (ref 8.5–10.1)
Co2: 28 mmol/L (ref 21–32)
Creatinine: 1.02 mg/dL (ref 0.60–1.30)
EGFR (African American): >60
Glucose: 300 mg/dL — ABNORMAL HIGH (ref 65–99)
SGOT(AST): 37 U/L (ref 15–37)
Sodium: 134 mmol/L — ABNORMAL LOW (ref 136–145)

## 2012-07-07 LAB — CBC WITH DIFFERENTIAL/PLATELET
Basophil #: 0 10*3/uL (ref 0.0–0.1)
Basophil %: 0 %
Eosinophil #: 0.1 10*3/uL (ref 0.0–0.7)
Eosinophil %: 0.5 %
HGB: 8.5 g/dL — ABNORMAL LOW (ref 12.0–16.0)
MCH: 27.9 pg (ref 26.0–34.0)
MCV: 86 fL (ref 80–100)
Monocyte #: 0.1 x10 3/mm — ABNORMAL LOW (ref 0.2–0.9)
Neutrophil #: 11.6 10*3/uL — ABNORMAL HIGH (ref 1.4–6.5)
Platelet: 163 10*3/uL (ref 150–440)
RBC: 3.05 10*6/uL — ABNORMAL LOW (ref 3.80–5.20)
RDW: 15.9 % — ABNORMAL HIGH (ref 11.5–14.5)
WBC: 12 10*3/uL — ABNORMAL HIGH (ref 3.6–11.0)

## 2012-07-07 LAB — PROTIME-INR: INR: 1.3

## 2012-07-07 LAB — TROPONIN I: Troponin-I: 0.07 ng/mL — ABNORMAL HIGH

## 2012-07-07 LAB — CK TOTAL AND CKMB (NOT AT ARMC)
CK, Total: 30 U/L (ref 21–215)
CK-MB: <0.5 ng/mL — ABNORMAL LOW (ref 0.5–3.6)
CK-MB: <0.5 ng/mL — ABNORMAL LOW (ref 0.5–3.6)

## 2012-07-08 LAB — CBC WITH DIFFERENTIAL/PLATELET
Basophil %: 0.1 %
HCT: 25.5 % — ABNORMAL LOW (ref 35.0–47.0)
Lymphocyte #: 0.1 10*3/uL — ABNORMAL LOW (ref 1.0–3.6)
Lymphocyte %: 0.8 %
MCH: 27.4 pg (ref 26.0–34.0)
MCV: 86 fL (ref 80–100)
Monocyte #: 0 x10 3/mm — ABNORMAL LOW (ref 0.2–0.9)
Monocyte %: 0.4 %
Platelet: 138 10*3/uL — ABNORMAL LOW (ref 150–440)
RBC: 2.96 10*6/uL — ABNORMAL LOW (ref 3.80–5.20)
RDW: 16.2 % — ABNORMAL HIGH (ref 11.5–14.5)
WBC: 11.4 10*3/uL — ABNORMAL HIGH (ref 3.6–11.0)

## 2012-07-08 LAB — SODIUM: Sodium: 132 mmol/L — ABNORMAL LOW (ref 136–145)

## 2012-07-09 LAB — BODY FLUID CELL COUNT WITH DIFFERENTIAL
Basophil: 0 %
Nucleated Cell Count: 921 /mm3
Other Mononuclear Cells: 39 %

## 2012-07-09 LAB — GLUCOSE, SEROUS FLUID: Glucose, Body Fluid: 184 mg/dL

## 2012-07-09 LAB — PROTEIN, BODY FLUID: Protein, Body Fluid: 2.9 g/dL

## 2012-07-09 LAB — ALBUMIN, FLUID (OTHER): Body Fluid Albumin: 1.5 g/dL

## 2012-07-09 LAB — APTT: Activated PTT: 25.3 secs (ref 23.6–35.9)

## 2012-07-09 LAB — VANCOMYCIN, TROUGH: Vancomycin, Trough: 14 ug/mL (ref 10–20)

## 2012-07-09 LAB — LACTATE DEHYDROGENASE, PLEURAL OR PERITONEAL FLUID: LDH, Body Fluid: 138 U/L

## 2012-07-10 LAB — BASIC METABOLIC PANEL
Anion Gap: 9 (ref 7–16)
Chloride: 98 mmol/L (ref 98–107)
Co2: 25 mmol/L (ref 21–32)
Glucose: 206 mg/dL — ABNORMAL HIGH (ref 65–99)
Osmolality: 278 (ref 275–301)
Potassium: 4.9 mmol/L (ref 3.5–5.1)
Sodium: 132 mmol/L — ABNORMAL LOW (ref 136–145)

## 2012-07-10 LAB — CBC WITH DIFFERENTIAL/PLATELET
Eosinophil #: 0 10*3/uL (ref 0.0–0.7)
HGB: 10 g/dL — ABNORMAL LOW (ref 12.0–16.0)
MCHC: 33.2 g/dL (ref 32.0–36.0)
Monocyte #: 0.4 x10 3/mm (ref 0.2–0.9)
Neutrophil %: 89.5 %
Platelet: 77 10*3/uL — ABNORMAL LOW (ref 150–440)

## 2012-07-12 ENCOUNTER — Ambulatory Visit: Payer: Self-pay | Admitting: Oncology

## 2012-07-12 LAB — CULTURE, BLOOD (SINGLE)

## 2012-08-09 DEATH — deceased

## 2013-11-20 IMAGING — XA IR VASCULAR PROCEDURE
1 series · 1 of 1 positions shown · non-contrast
Comparison: none

[Series 1: single · 1 of 1 slices shown]
[im 1/1]
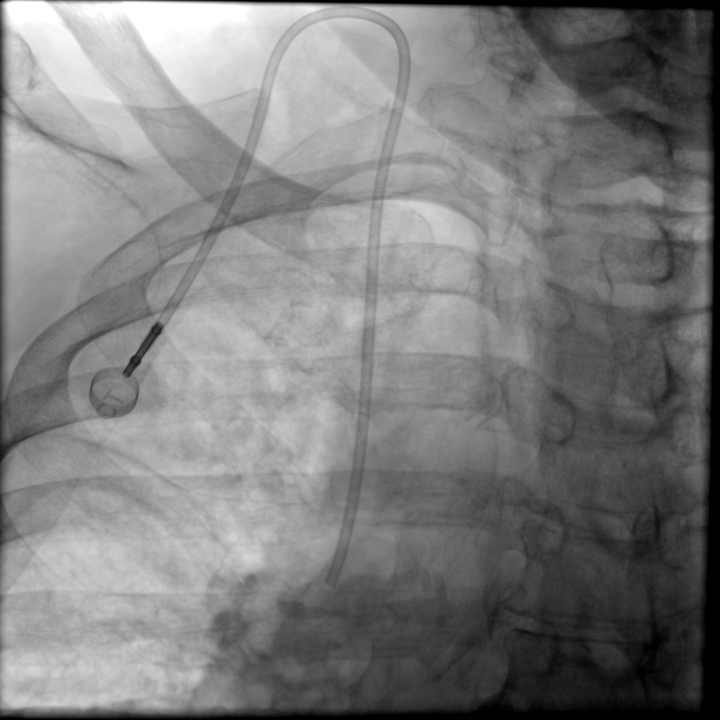

[1 of 1 positions shown; findings below may reference images not displayed]

IMAGES IMPORTED FROM THE SYNGO WORKFLOW SYSTEM
NO DICTATION FOR STUDY

## 2014-07-30 IMAGING — CT CT ABDOMEN W/ CM
1 of 3 series · 12 of 32 positions shown, 18 images · non-contrast
Comparison: none

REASON FOR EXAM: labs  pancreatic cancer restaging
COMMENTS:

[Series 4: soft tissue · axial · 0.77mm/px · z∈[-347,-104]mm · 12 of 97 slices shown, 18 images]
[im 8/97  soft-tissue]
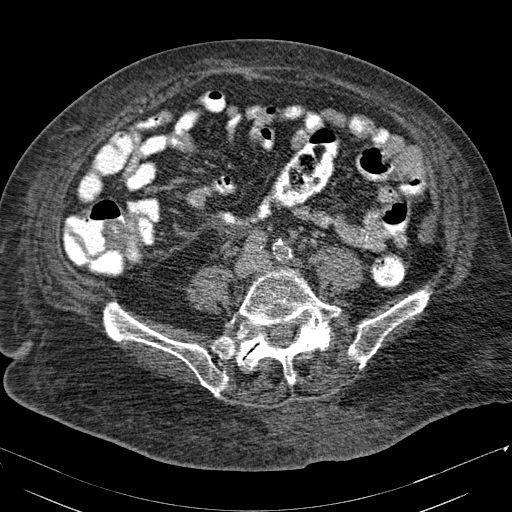
[im 8/97  bone]
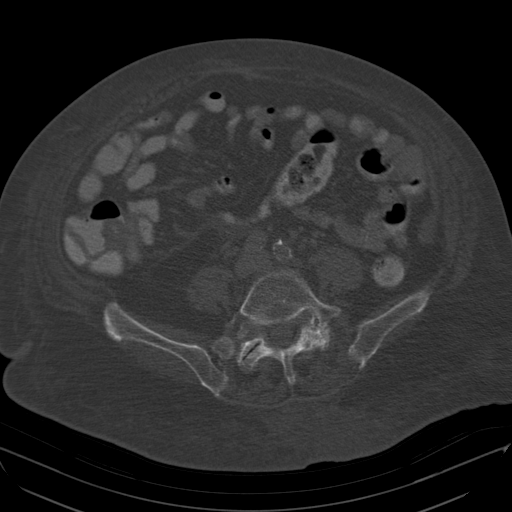
[im 15/97  soft-tissue]
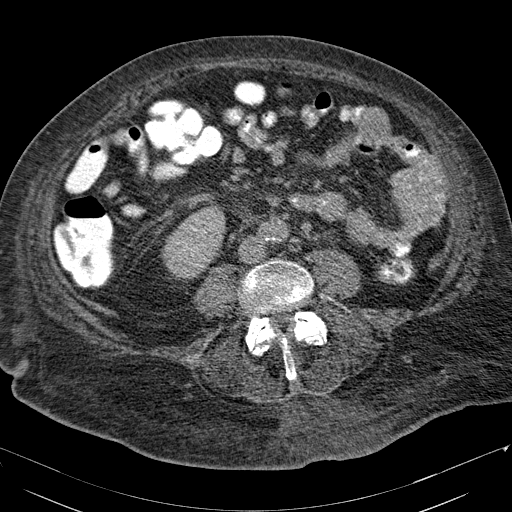
[im 23/97  soft-tissue]
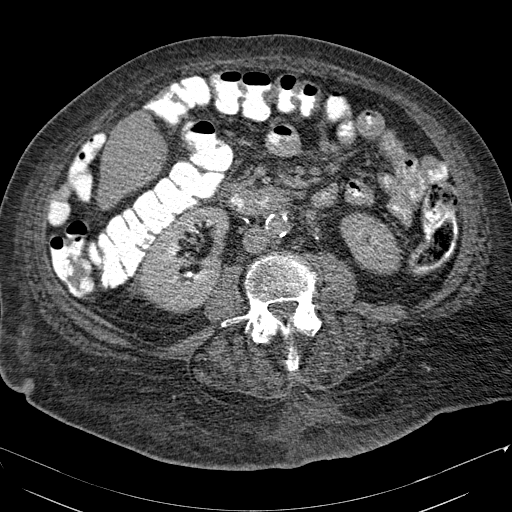
[im 30/97  soft-tissue]
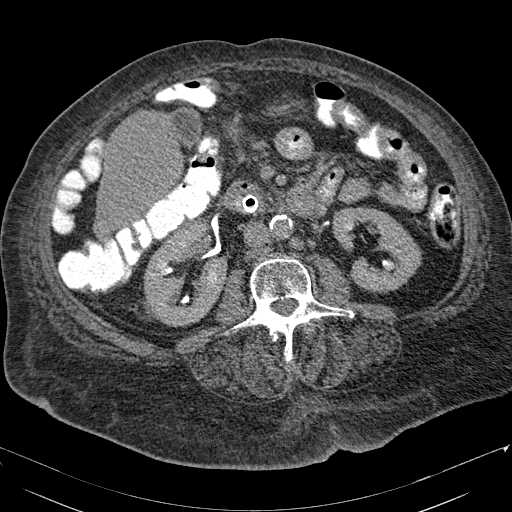
[im 37/97  soft-tissue]
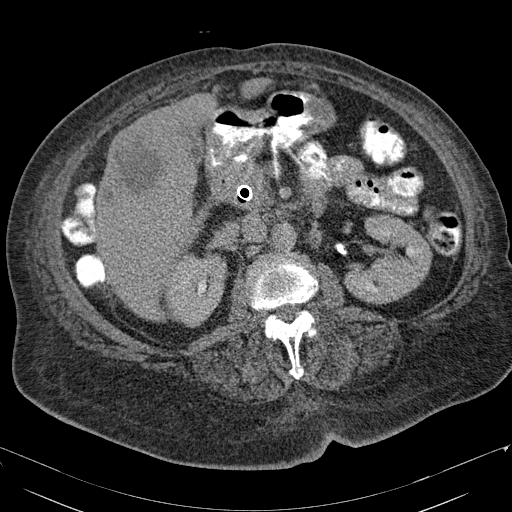
[im 45/97  soft-tissue]
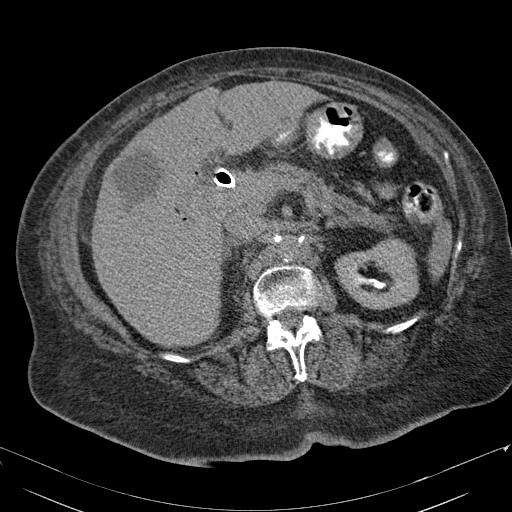
[im 52/97  soft-tissue]
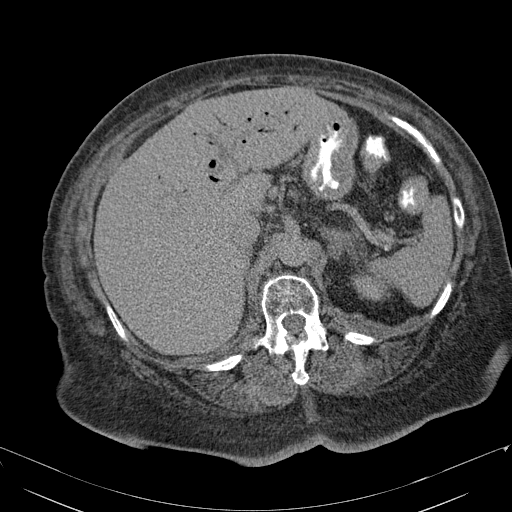
[im 60/97  soft-tissue]
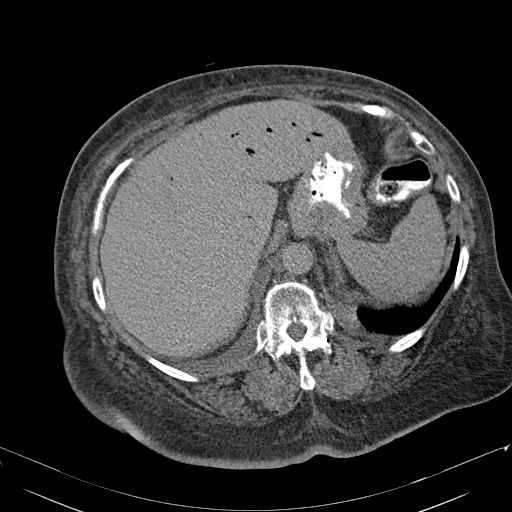
[im 67/97  soft-tissue]
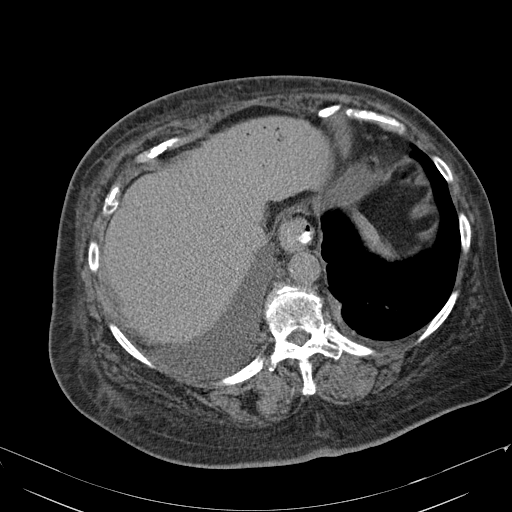
[im 67/97  lung]
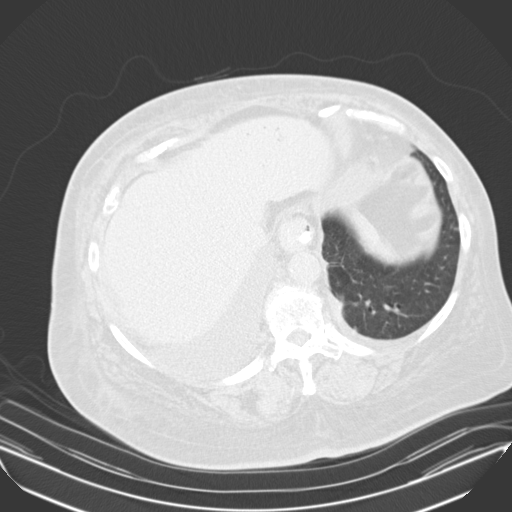
[im 67/97  bone]
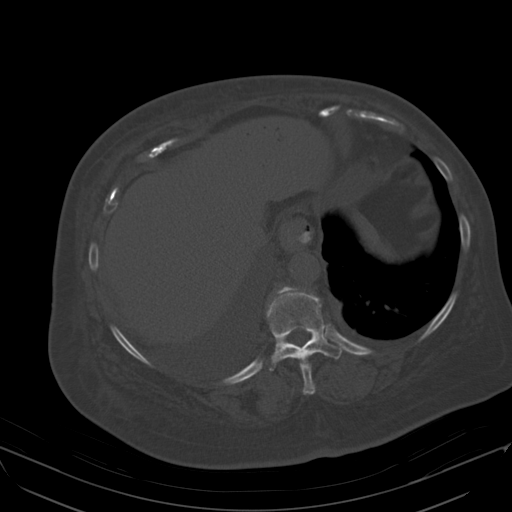
[im 74/97  soft-tissue]
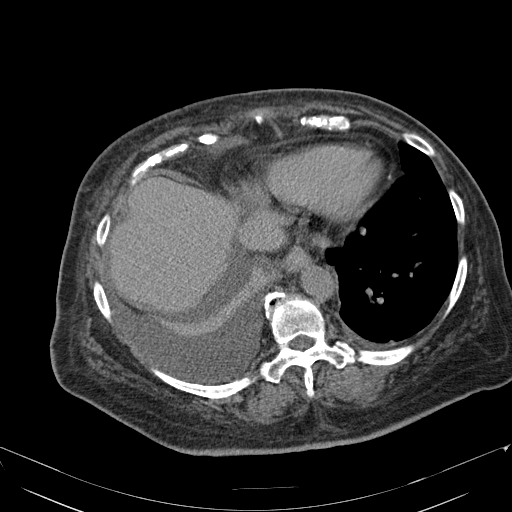
[im 74/97  lung]
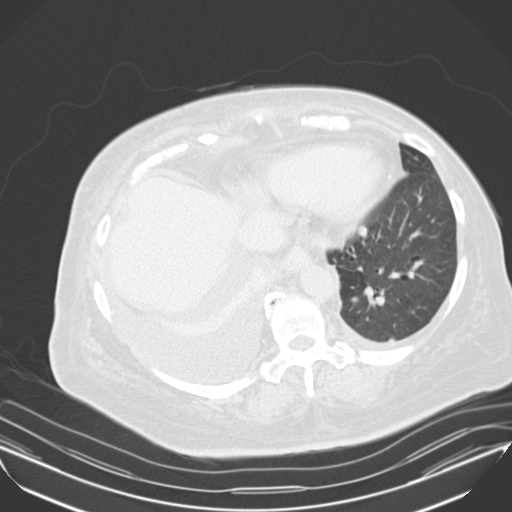
[im 82/97  soft-tissue]
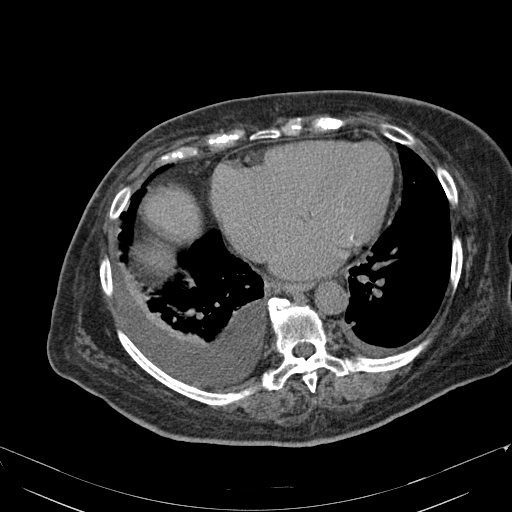
[im 82/97  lung]
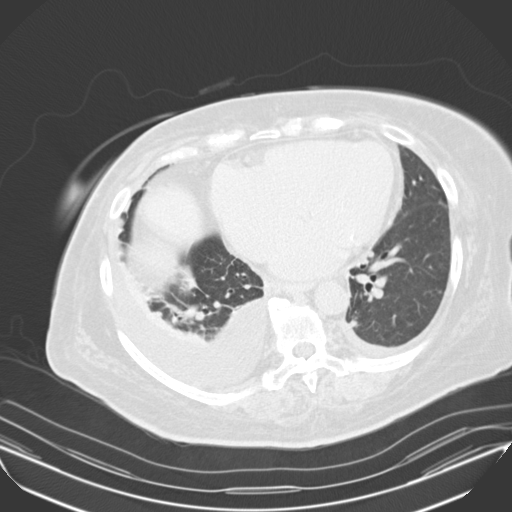
[im 89/97  soft-tissue]
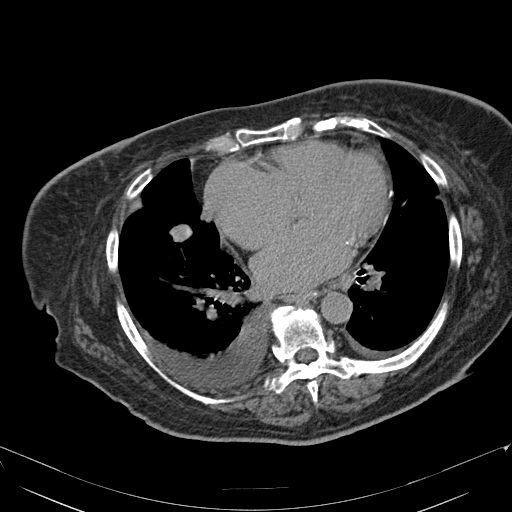
[im 89/97  lung]
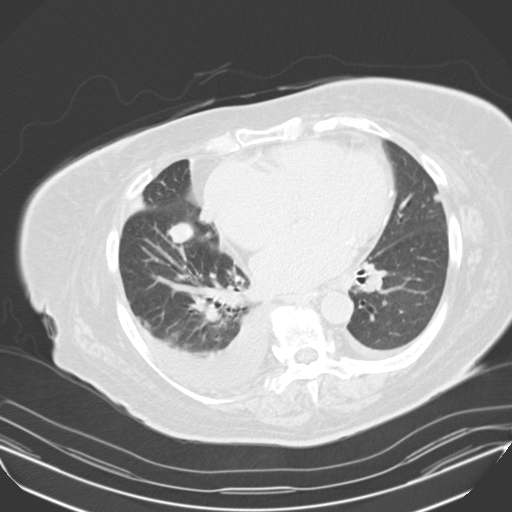

[12 of 32 positions shown; findings below may reference images not displayed]

PROCEDURE:     [facility] - [facility] CAR ABDOMEN STANDARD W  - June 16, 2012 [DATE]

RESULT:     CT of the abdomen is performed utilizing 100 mL of [id number]
iodinated intravenous contrast with oral contrast. Images are reconstructed
at 3.0 mm slice thickness in the axial plane. Comparison is made to a
previous exam dated 15 August, 2011 and to images dated 07 January, 2012.

Since the previous study the patient has developed bilateral pleural
effusions with a trace effusion on the left and a small to moderate amount
on the right. There is some compressive atelectasis in the right lower lobe.
Areas of nodular density are present in both lower lobes with the largest
area seen on image #5 measuring 1.86 cm medial to lateral and 1.5 cm
anterior to posterior. Additional smaller areas of nodularity are present
bilaterally. There is a new hypoattenuating area in the liver between images
#43 and #62 with some smaller adjacent areas of nodularity and
hyperenhancement. The margins are somewhat irregular. This shows a maximum
AP dimension of approximately 4.5 cm with a medial to lateral dimension of
4.2 cm. Pneumobilia is present. There is a biliary stent in the common duct
extending to the area of the ampulla in the pancreatic head. The gallbladder
is nondistended and shows no definite stones. The pancreatic duct is mildly
prominent in the pancreatic body/tail region on image #46 measuring 3.6 mm.
There is no abnormal bowel distention. The kidneys appear unremarkable. The
aorta is normal in caliber with atherosclerotic calcification present. The
adrenal glands show no discrete mass. The spleen appears unremarkable.
Degenerative changes are seen in the spine. Respiratory motion artifact is
present. There is ill-defined low attenuation in the porta hepatis region
which could represent adenopathy or mass. A well-defined pancreatic head
mass is difficult to demonstrate. There is some third spacing of fluid in
the subcutaneous tissues.
IMPRESSION: 1. Findings concerning for progression of disease with possible metastasis
to the lungs and liver. A hepatic abscess is felt to be less likely.
Correlate clinically. Biliary stent is present with pneumobilia
demonstrated. There is possible adenopathy in the porta hepatis.

[REDACTED]

## 2014-10-01 NOTE — Consult Note (Signed)
History of Present Illness:   Reason for Consult Carcinoma of pancreas metastatic to liver and lung  gemcitabine therapy Last chemotherapy was in January 24.    HPI   80 year old lady came today further followup.  Has progressive right upper quadrant abdominal pain.  Has progressively increased tumor marker.  patient was admitted in the hospital with fatigue  and malaise.  Patient's  fell from the bed.  According to her she slid from the bed.  Does not out or did not have any syncopal attack did some was admitted in the hospital with anemia underwent CT scan of the chest.  PFSH:   Family History noncontributory    Social History noncontributory    Additional Past Medical and Surgical History As mentioned above   Review of Systems:   General weakness  fatigue    HEENT no complaints    Lungs cough  SOB    Cardiac no complaints    GI nausea  poor appetite    GU has . Foley catheter    Musculoskeletal no complaints    Extremities swelling    Skin no complaints    Neuro no complaints    Endocrine no complaints    Psych anxiety   NURSING NOTES: *ED Nurses Notes:   26-Jan-14 15:20    Chief Complaint Quote/Onset: Chief Complaint Quote/Onset:: Weak episode this afternoon, approx 1pm, slid to floor. Pt recalls incident, no LOC. No chest pain, slight SOB. SAT noted 83 on room air, 02 4l with increase to 97. MD to bedside.    Acuity: 3    Chief Complaint:: Weakness    Patient Destination: Treatment room; er6    Flex Candidate?: no    Temp Temperature: 97.9    Temp Source: oral    Pulse Pulse: 99    Respirations Respirations: 22    SBP SBP: 103    DBP DBP: 50    Pulse Ox % Pulse Ox %: 97    Pulse Ox Source Source: Oxygen; 4l    Pain Scale (0-10) Pain Scale (0-10): Scale:0    Height Type: stated    Height (ft) (feet): 5    Height (in) (in): 7    Height (cm) centimeters: 170.1    Weight Type: Stated    Current Weight (lbs) (lbs): 195    Current  Weight (kg) (kg): 88.4    BSA (m2): 1.9    BMI (kg/m2): 30.5    Self Harm Assessment (On Admission and PRN) Self Harm Assessment: Patient does not have a self-harm diagnosis.; No thoughts of harming self.    EMS Patient: EMS Patient    15:35    Name of MD Notified: Clemens Catholic    15:55    Home Meds Status: Patient/Family (Verbal)    19:45    Do you want [place] to notify a family member?: NO    Name of Family Member Contacted: family is present    Location: left; antecubital    Catheter Size: 18 gauge    Inserted By: Other:; unknown who started, prior to this rn arrival.    IV Device [WDL Definition: site w/o redness, warmth, swelling, pain, palpable cord, streak formation, drainage]: WDL    Reassessment/Notes: per lab record they have recieved labs ordered including blood cultures prior to this rn arrival to facility.  NURSING NOTES: **Vital Signs.:   27-Jan-14 15:28    Vital Signs Type: Routine    Temperature Temperature (F): 97.8    Celsius: 36.5  Temperature Source: Oral    Pulse Pulse: 638    Systolic BP Systolic BP: 453    Diastolic BP (mmHg) Diastolic BP (mmHg): 96    Mean BP: 110    Pulse Ox % Pulse Ox %: 97    Pulse Ox Activity Level: At rest    Oxygen Delivery: 2L   Physical Exam:   General Patient is alert and oriented not in any acute distress    HEENT: normal    Lungs: crepitations  rhonchi    Cardiac: regular rate, rhythm  Tachycardia    Abdomen: soft  positive bowel sounds  tender    Skin: intact    Musculoskeletal: swollen/deformed joints    Extremities: edema    Neuro: AAOx3  cranial nerves intact    Psych: normal appearance    Physical Exam No palpable lymphadenopathy     COPD:    CHF:    diabetes:    htn, diabetes:    left THR:    hysterectomy:    Penicillin: Rash    Duragesic-12 12 mcg/hr transdermal film, extended release: 1 PATCH transdermal every 72 hours, Active, 10, None   promethazine 25 mg oral  tablet: 1 tab(s) orally every 6 hours, As Needed, Active, 90, 3   glyBURIDE-metformin 5 mg-500 mg oral tablet: 2 tab(s) orally 2 times a day, Active, 0, None   carvedilol 6.25 mg oral tablet: 1 tab(s) orally 2 times a day, Active, 0, None   Spiriva 18 mcg inhalation capsule: Inhale 1 cap(s) via handihaler once a day as needed. **rx written for 1 cap via handihaler once a day**, Active, 0, None   Diltiazem Hydrochloride ER 300 mg/24 hours oral capsule, extended release: 1 cap(s) orally once a day, Active, 0, None   spironolactone 25 mg oral tablet: 1 tab(s) orally once a day, Active, 0, None   Advair Diskus 250 mcg-50 mcg inhalation powder: 1 puff(s) inhaled 2 times a day, Active, 0, None   Nexium 40 mg oral delayed release capsule: 1 cap(s) orally once a day, Active, 0, None   ProAir HFA 90 mcg/inh inhalation aerosol: 1 puff(s) inhaled once a day as needed as directed by physician., Active, 0, None   Lantus 100 units/mL subcutaneous solution: Inject 36 unit(s) subcutaneous once a day (at bedtime) as directed by physician., Active, 0, None   furosemide 40 mg oral tablet: 1 tab(s) orally 2 times a day, Active, 0, None   potassium chloride 20 mEq oral tablet, extended release: 1 tab(s) orally once a day, Active, 0, None  Laboratory Results: Hepatic:  27-Jan-14 00:31    Bilirubin, Total  1.1   Alkaline Phosphatase  356   SGPT (ALT) 45   SGOT (AST) 37   Total Protein, Serum 6.4   Albumin, Serum  2.4  Lab:  27-Jan-14 04:00    O2 Saturation (Pulse Ox) 89   FiO2 (Pulse Ox) ra (Result(s) reported on 07 Jul 2012 at 04:44AM.)  Cardiology:  27-Jan-14 10:23    Echo Doppler  Interpretation Summary   The left ventricle is mildly dilated. There is no thrombus. Left  ventricular systolic function is mild to moderately reduced.  Ejection Fraction = 35-45%. There is normal left ventricular wall  thickness. There is mildto moderate global hypokinesis of the left  ventricle. Flattened septum  is consistent with RV pressure/volume  overload. The right ventricle is moderately dilated. The right  ventricular systolic function is mildly reduced. There is mild to  moderate mitral regurgitation. There is  moderate tricuspid  regurgitation. Right ventricular systolic pressure is elevated at  50-69mmHg.  Procedure:   A two-dimensional transthoracic echocardiogram with color flow and  Doppler was performed.  Left Ventricle   Left ventricular systolic function is mild to moderately reduced.   Ejection Fraction = 35-45%.   There is mild to moderate global hypokinesis of the left ventricle.   The left ventricle is mildly dilated.   There is no thrombus. There is normal left ventricular wall thickness.   Flattened septum is consistent with RV pressure/volume overload.  Right Ventricle   The right ventricle is moderately dilated.   There is normal right ventricular wall thickness.   The right ventricular systolic function is mildly reduced.  Atria   The left atrium is mildly dilated.   Borderline right atrial enlargement.  Mitral Valve   The mitral valve leaflets appear thickened, but open well.   There is mild to moderate mitral regurgitation.  Tricuspid Valve   Right ventricular systolic pressure is elevated at 50-58mmHg.   The tricuspid valve is not well visualized, but is grossly normal.   There is moderate tricuspid regurgitation.  Aortic Valve   The aortic valve is normal in structure and function.   Trace aortic regurgitation.  Pulmonic Valve   The pulmonic valve is not well seen, but is grossly normal.   There is no pulmonic valvular regurgitation.  Great Vessels   The aortic root is not well visualized but is probably normal size.  Pericardium/Pleural   No pericardial effusion.   There is no pleural effusion.  MMode 2D Measurements and Calculations   RVDd: 3.9 cm   IVSd: 1.0 cm   LVIDd: 4.8 cm   LVIDs: 3.8 cm   LVPWd: 1.0 cm   FS: 21 %   EF(Teich): 43  %   Ao root diam: 3.5 cm   LA dimension: 4.6 cm  Doppler Measurements and Calculations   TR Max vel: 338 cm/sec   TR Max PG: 46 mmHg   RVSP: 56 mmHg   RAP systole: 10 mmHg  Reading Physician: Lujean Amel  Sonographer:Roberts, Bartelso Interpreting Physician:  Lujean Amel,  electronically signed on  07-07-2012 14:21:56 Requesting Physician: Lujean Amel  Routine Chem:  27-Jan-14 00:31    Result Comment TROPONIN - RESULTS VERIFIED BY REPEAT TESTING.  - CALLED TO SHARON BUCHANAN$[email or web address]$ ,07/07/12  - READ-BACK PROCESS PERFORMED.  - [facility]  Result(s) reported on 07 Jul 2012 at 01:33AM.   Glucose, Serum  300   BUN  26   Creatinine (comp) 1.02   Sodium, Serum  134   Potassium, Serum 4.1   Chloride, Serum  97   CO2, Serum 28   Calcium (Total), Serum  8.0   Osmolality (calc) 284   eGFR (African American) >60   eGFR (Non-African American)  53 (eGFR values <30mL/min/1.73 m2 may be an indication of chronic kidney disease (CKD). Calculated eGFR is useful in patients with stable renal function. The eGFR calculation will not be reliable in acutely ill patients when serum creatinine is changing rapidly. It is not useful in  patients on dialysis. The eGFR calculation may not be applicable to patients at the low and high extremes of body sizes, pregnant women, and vegetarians.)   Anion Gap 9    08:26    Result Comment TROPONIN - RESULTS VERIFIED BY REPEAT TESTING.  - RESULT PREVIOUSLY CALLED 07/07/12 AT  - 0134 BY [facility]  Result(s) reported on 07 Jul 2012 at 09:22AM.  Cardiac:  27-Jan-14  00:31    CK, Total 36   CPK-MB, Serum  < 0.5 (Result(s) reported on 07 Jul 2012 at 01:28AM.)   Troponin I  0.07 (0.00-0.05 0.05 ng/mL or less: NEGATIVE  Repeat testing in 3-6 hrs  if clinically indicated. >0.05 ng/mL: POTENTIAL  MYOCARDIAL INJURY. Repeat  testing in 3-6 hrs if  clinically indicated. NOTE: An increase or decrease  of 30% or more on serial  testing suggests a   clinically important change)    08:26    CK, Total 30   CPK-MB, Serum  < 0.5 (Result(s) reported on 07 Jul 2012 at 09:17AM.)   Troponin I  0.06 (0.00-0.05 0.05 ng/mL or less: NEGATIVE  Repeat testing in 3-6 hrs  if clinically indicated. >0.05 ng/mL: POTENTIAL  MYOCARDIAL INJURY. Repeat  testing in 3-6 hrs if  clinically indicated. NOTE: An increase or decrease  of 30% or more on serial  testing suggests a  clinically important change)  Routine Coag:  27-Jan-14 00:31    Prothrombin  17.0   INR 1.3 (INR reference interval applies to patients on anticoagulant therapy. A single INR therapeutic range for coumarins is not optimal for all indications; however, the suggested range for most indications is 2.0 - 3.0. Exceptions to the INR Reference Range may include: Prosthetic heart valves, acute myocardial infarction, prevention of myocardial infarction, and combinations of aspirin and anticoagulant. The need for a higher or lower target INR must be assessed individually. Reference: The Pharmacology and Management of the Vitamin K  antagonists: the seventh ACCP Conference on Antithrombotic and Thrombolytic Therapy. TKPTW.6568 Sept:126 (3suppl): [id number]. A HCT value >55% may artifactually increase the PT.  In one study,  the increase was an average of 25%. Reference:  "Effect on Routine and Special Coagulation Testing Values of Citrate Anticoagulant Adjustment in Patients with High HCT Values." American Journal of Clinical Pathology 2006;126:400-405.)  Routine Hem:  27-Jan-14 00:31    WBC (CBC)  12.0   RBC (CBC)  3.05   Hemoglobin (CBC)  8.5   Hematocrit (CBC)  26.2   Platelet Count (CBC) 163   MCV 86   MCH 27.9   MCHC 32.5   RDW  15.9   Neutrophil % 97.1   Lymphocyte % 1.3   Monocyte % 1.1   Eosinophil % 0.5   Basophil % 0.0   Neutrophil #  11.6   Lymphocyte #  0.2   Monocyte #  0.1   Eosinophil # 0.1   Basophil # 0.0 (Result(s) reported on 07 Jul 2012 at 01:03AM.)    Assessment and Plan:  Impression:   1. Carcinoma of pancreas, Unresectable.  On radiation and chemotherapy. has finished 5-FU and radiation therapy in June of 2013 was started on gemcitabinect scan  shows progressive disease.  need thoracentesi on the right can beogy to examinationhad detail with patient and family  about  possibility of living will and no CPR>  Patient is thinking seriously about this   Plan:   Possible thoracentesisissue about no code   tomorrow again   Electronic Signatures: Jobe Gibbon (MD)  (Signed 27-Jan-14 21:08)  Authored: HISTORY OF PRESENT ILLNESS, PFSH, ROS, NURSING NOTES, PE, PAST MEDICAL HISTORY, ALLERGIES, HOME MEDICATIONS, LABS, ASSESSMENT AND PLAN   Last Updated: 27-Jan-14 21:08 by Jobe Gibbon (MD)

## 2014-10-01 NOTE — Discharge Summary (Signed)
PATIENT NAME:  Christy CarboWHITE, [name] MR#:  [id number] DATE OF BIRTH:  25-Nov-1934  DATE OF ADMISSION:  07/06/2012 DATE OF DISCHARGE:  07/10/2012  ADMITTING DIAGNOSIS: Acute respiratory failure.   DISCHARGE DIAGNOSES: 1. Acute on chronic respiratory failure due to pneumonia, likely postobstructive pneumonia, also metastatic pancreatic cancer to lungs.  2. Large right pleural effusion status post thoracentesis on 29th of January 2014; 800 mL of fluid removed. 3. Congestive heart failure, left heart, acute pulmonary edema, likely systolic.  4. Acute cardiomyopathy with ejection fraction of 35% to 45%, likely secondary. 5. Chronic obstructive pulmonary disease exacerbation due to pneumonia.  6. Non-Q-wave myocardial infarction, second type. 7. Congestive heart failure, left heart, acute on chronic, systolic.  8. As mentioned above cardiomyopathy, right ventricular systolic pressure elevated to 50 mmHg to 60 mmHg with moderate tricuspid regurgitation. 9. Diabetes, insulin-dependent.  10. Dehydration.  11. Malnutrition, failure to thrive, adult.  12. Metastatic pancreatic cancer. 13. Chronic pain syndrome.  14. Hypotension with history of hypertension.  15. Urinary retention, which is new.  CODE STATUS: FULL.  DIET: As tolerated, Ensure 240 mL 4 times a day.  ACTIVITY LIMITATIONS: As tolerated.   ADDITIONAL COMMENTS: Medications can be crushed or use liquid when appropriate. May be changed to rectal if unable to swallow.   Foley catheter should be left in place for urinary incontinence and prevention of skin breakdown.   OXYGEN: 2 liters of oxygen through nasal cannula continuously.  DISCHARGE MEDICATIONS: 1. Promethazine 25 mg p.o. every 6 hours as needed.  2. Carvedilol 6.25 mg twice daily. 3. Advair Diskus 250/50 one puff twice daily.  4. Diltiazem ER 300 mg p.o. once daily.  5. Nexium 40 mg p.o. daily. 6. Lantus 36 units subcutaneously at bedtime.  7. Pro Air HFA 1 puff once daily as  needed.  8. Spiriva 1 capsule once daily as needed.  9. Duragesic 25 mcg transdermal patch every 72 hours.  10. Atorvastatin 10 mg p.o. at bedtime.  11. Prednisone 60 mg p.o. once on the 31st of January 2014 then by 10 mg every 2 days until stop.  12. Docusate 100 mg p.o. twice daily.  13. Levaquin 250 mg every day for 7 more days.  14. Dextromethorphan guaifenesin 10 mL every 4 hours as needed.  15. Detrol LA 2 mg once a day.   NOTE: The patient is not to take glyburide, metformin, spironolactone, furosemide or potassium supplements.   DISPOSITION: The patient is being discharged to hospice home. No followups are going to be arranged.   CONSULTANTS: Dr. Doylene Canninghoksi, care management, palliative care.   RADIOLOGIC STUDIES: Chest portable single view on 26th of January 2014 revealed likely cardiomegaly was increasing density in lower half of right hemithorax concerning for underlying pneumonia and/or atelectasis with effusion. Also pulmonary vascular congestion. The heart is enlarged. Developing ARDS is not excluded, according to radiologist.  Chest PA and lateral on 27th of January 2014 revealed stable findings as compared to prior study.  CT scan of chest without contrast on 27th of January 2014 revealed wide spread pulmonary parenchymal masses which have increased in conspicuity since the prior study. Only small portion of right midlung lobe and approximately one half of right upper lobe remain aerated. The remainder of the lung parenchyma is consolidated. This is likely due to essential obstructing bronchial lesion. There is a large right pleural effusion. There is a small left pleural effusion which has increased in size since the prior study. Michelle spread pulmonary parenchymal masses within  both lungs are compatible with progressive metastatic disease. The cardiac chambers are enlarged. There are likely enlarged mediastinal and hilar lymph nodes, according to radiologist.  Repeated chest x-ray,  one view, status post thoracentesis, 29th of January 2014, showed no evidence of pneumothorax, status post ultrasound-guided thoracentesis.  Ultrasound-guided thoracentesis on the right, 29th of January 2014, was unremarkable. Approximately 800 mL of straw-colored thin fluid was removed from pleural space.  Echocardiogram 27th of January 2014: Left ventricle mildly dilated. There is no thrombus. Left ventricular systolic function is mildly to moderately reduced. Ejection fraction of 35% to 45%. There is normal left ventricular wall thickness. There is mild to moderate global hypokinesis of left ventricle. Findings are consistent with right ventricular pressure volume overload. The right ventricle is moderately dilated. Right ventricular systolic function is mildly reduced. There is mild to moderate mitral regurgitation. There is moderate tricuspid regurgitation. Right ventricular systolic pressure is elevated at 50 mmHg to 60 mmHg.   HISTORY OF PRESENT ILLNESS: The patient is a 80 year old African American female with history of pancreatic cancer who presents to the hospital with complaints of shortness of breath as well as weakness. Please refer to Dr. Judithann Sheen admission note on the 26th of January 2014.   On arrival to the hospital, the patient's blood pressure was 117/64, heart rate was 88 and respiratory rate was 18. She was afebrile. Oxygen saturation was 96% on 3 liters of oxygen through nasal cannula. She had basilar crackles and rhonchi in the right base and respiratory effort was mildly increased. A chest x-ray revealed density in the lower half of right hemithorax concerning for pneumonia or effusion.   The patient's lab data done on the day of admission, 26th of January 2014, revealed markedly elevated B-type natriuretic peptide of 7175, glucose of 415 and sodium level of 134, otherwise BMP was unremarkable. The patient's liver enzymes revealed albumin level of 2.5, a total bilirubin of 1.3,  alkaline phosphatase 382 and AST 57. Cardiac enzymes, first set was unremarkable, however second set revealed mild elevation of troponin to 0.07 and third set 0.06.  The patient's [place] blood cell count was elevated at 11.9, hemoglobin was 8.8 and platelet count 181. Absolute neutrophil count was 11.4. Coagulation panel revealed pro time of 17.0 and INR was 1.3. Blood cultures taken on the 26th of January 2014 showed no growth. Urinalysis revealed amber cloudy urine, 150 mg/dL of glucose, negative for bilirubin or ketones, specific gravity was 1.014, pH was 5.0, negative for blood, protein and nitrites, trace leukocyte esterase was noted and 2 red blood cells as well as 7 Kross blood cells were noted, trace bacteria, 2 epithelial cells and mucous was present. EKG showed accelerated junctional rhythm, low voltage QRS at 97 beats per minute, nonspecific ST-T wave abnormality The patient's chest x-ray, as mentioned above, showed right pleural effusion versus pneumonia.   HOSPITAL COURSE: The patient was admitted to the hospital for further evaluation. First, in regards to acute on chronic respiratory failure, it was felt that the patient's acute on chronic respiratory failure was very likely combination of first pleural effusion on the right side which was tapped on the 29th of January 2014 and 800 mL of fluid was removed and secondly pneumonia  which was felt to be likely postobstructive which was obvious on CT scan of her chest. The patient is to continue antibiotic therapy and antibiotics were continued while she was in the hospital.  For COPD exacerbation due to pneumonia, the patient was continued on  steroids as well as inhalation therapy with mild improvement of her symptoms.   For congestive heart failure, the patient was severely dehydrated initially on arrival to the hospital with some intravascular depletion and because her oral intake was not satisfactory we felt that it would be not in her best  interest to continue her on diuretics so no Lasix was prescribed for her. She is noted to have cardiomyopathy with ejection fraction of 35% to 45%, which was felt to be very likely due to chemotherapy. She had mild elevation of troponin, which was felt to be due to non-Q-wave MI, likely secondary type, due to tachycardia. No further cardiac evaluation was recommended due to end stage of her pancreatic cancer.  In regards to diabetes, the patient's glucose levels were high while she was on steroids and it is recommended to follow the patient's blood glucose levels and discontinue her insulin completely, especially if her oral intake is found satisfactory and her steroids are stopped.   For dehydration, as mentioned above, the patient was severely dehydrated initially. She was found to be malnourished. She had failure to thrive as well as weight loss. She was somewhat dehydrated in the hospital; however, her hydrating IV fluids were stopped due to CHF. Diuretics were not initiated due to poor oral intake.   For metastatic pancreatic cancer, the patient was consulted by Dr. Doylene Canning who felt the patient would benefit from hospice care. He did not recommend any chemotherapy.  For chronic pain syndrome, the patient's pain medications were advanced. The patient is to continue pain medications which can be advanced if needed, even higher.  The patient was noted to be hypotensive on arrival to the hospital which was felt to be due to hydration status as well as possibly atrial fibrillation or fast junctional rhythm. Initially the patient was given some IV fluids. However, the patient's IV fluids were stopped due to congestive heart failure.   The patient was noted to have urinary retention and Foley catheter had to be placed because of significant urinary retention. The patient's Foley catheter should be continued whenever she is in hospice home.   The patient is being discharged in poor condition. No further  followup will be recommended for her. ____________________________ Katharina Caper, MD rv:sb D: 07/10/2012 21:09:00 ET T: 07/11/2012 09:58:52 ET JOB#: [id number]  cc: Katharina Caper, MD, <Dictator> Janak K. Doylene Canning, MD Katharina Caper MD ELECTRONICALLY SIGNED 07/31/2012 18:53

## 2014-10-01 NOTE — H&P (Signed)
PATIENT NAME:  Christy Wilson, Christy Wilson MR#:  [id number] DATE OF BIRTH:  Nov 29, 1934  DATE OF ADMISSION:  07/06/2012  REFERRING PHYSICIAN: Dr. Clemens Wilson   FAMILY PHYSICIAN: Dr. Elizabeth Sauereanna Wilson   REASON FOR ADMISSION: Shortness of breath with weakness.   HISTORY OF PRESENT ILLNESS: The patient is a 80 year old female with pancreatic cancer with mets to the liver being followed by Dr. Doylene Wilson. He was recently placed on Vicodin and a Duragesic patch. She presents to the emergency room with weakness and shortness of breath where she was noted to be hypoxic. Chest x-ray suggests right lower lobe pneumonia with effusion as well as congestive heart failure. The patient has a history of CHF in the past, presumably due to hypertension and diastolic dysfunction. In the emergency room, the patient was weak and short of breath and is now admitted for further evaluation.   PAST MEDICAL HISTORY:  1.  Metastatic pancreatic cancer.  2.  Chronic obstructive pulmonary disease.  3.  History of diastolic congestive heart failure.  4.  Type 2 diabetes, on insulin.  5.  Benign hypertension.  6.  Previous left hip surgery.  7.  Status post hysterectomy.   MEDICATIONS:  1.  Aldactone 25 mg p.o. daily.  2.  Spiriva 1 capsule inhaled daily.  3.  ProAir 2 puffs every 4 hours as needed for shortness of breath.  4.  K-Dur 20 milliequivalents p.o. daily.  5.  Lantus 36 units subcutaneous at bedtime.  6.  Nexium 40 mg p.o. daily.  7.  Glyburide 10 mg p.o. Wilson.i.d.  8.  Metformin 1000 mg p.o. Wilson.i.d.  9.  Cardizem CD 300 mg p.o. daily.  10.  Coreg 6.25 mg p.o. Wilson.i.d.  11.  Advair 250/50 one puff Wilson.i.d.  12.  Duragesic 12.5 mcg topically every 3 days.  13.  Norco 5/325 one p.o. every 4 hours as needed for pain.   ALLERGIES: Penicillin.   SOCIAL HISTORY: The patient has a remote history of tobacco abuse but none recently. Denies alcohol abuse.   FAMILY HISTORY: Positive for coronary artery disease, hypertension, diabetes, but  negative for colon or prostate cancer.   Review of systems:  CONSTITUTIONAL: No fever or change in weight.  EYES: No blurred or double vision. No glaucoma.  ENT: No tinnitus or hearing loss. No nasal discharge or bleeding. No difficulty swallowing.  RESPIRATORY: The patient has had cough but no wheezing. Denies hemoptysis. No painful respiration.  CARDIOVASCULAR: No chest pain. Positive orthopnea. No palpitations or syncope.  GASTROINTESTINAL: No nausea, vomiting, or diarrhea. She does have some upper quadrant abdominal pain bilaterally. No change in bowel habits.  Genitourinary: No dysuria or hematuria. No incontinence.  ENDOCRINE: No polyuria or polydipsia. No heat or cold intolerance.  HEMATOLOGIC: The patient denies anemia, easy bruising, or bleeding.  LYMPHATIC: No swollen glands.  MUSCULOSKELETAL: The patient denies pain in her neck, back, shoulders, knees, or hips. No gout.  NEUROLOGIC: No numbness, although she does have weakness. Denies migraines, stroke or seizures.  PSYCH: The patient denies anxiety, insomnia, or depression.   PHYSICAL EXAMINATION:  GENERAL: The patient is currently in no acute distress.  VITAL SIGNS: Remarkable for blood pressure of 117/64 with a heart rate of 88 and respiratory rate of 18.  She is afebrile. Saturations 96% on 3 liters.  HEAD, EYES, EARS, NOSE, AND THROAT: Normocephalic, atraumatic. Pupils equally round and reactive to light and accommodation. Extraocular movements are intact. Sclerae are nonicteric. Conjunctivae are clear. Oropharynx is clear.  NECK: Supple  without JVD. No adenopathy or thyromegaly is noted.  LUNGS: Basilar rales with rhonchi at the right base. Respiratory effort is mildly increased.  CARDIAC: Regular rate and rhythm. Normal S1, S2. No significant rubs, murmurs, or gallops. PMI is nondisplaced. Chest wall is nontender.  ABDOMEN: Soft but tender in the upper quadrants. No rebound or guarding. Normoactive bowel sounds. Mild  hepatomegaly is noted. No splenomegaly present.  EXTREMITIES: Revealed trace edema without clubbing or cyanosis. Pulses are 2+ bilaterally.  SKIN: Warm and dry without rash or lesions.  NEUROLOGIC: Reveals cranial nerves II through XII grossly intact. Deep tendon reflexes were symmetric. Motor and sensory exam is nonfocal.  PSYCHIATRIC: Revealed a patient who was alert and oriented to person, place, and time. She was cooperative and used good judgment.   Laboratory DATA: Chest x-ray reveals cardiomegaly with density in the lower half of the right hemithorax concerning for pneumonia with effusion. Pulmonary vascular congestion is also noted. Her BNP was 7175. Troponin 0.05. Glucose 415 with a BUN of 26, creatinine of 1.04 with a sodium of 134 and a potassium of 4.2. GFR was greater than 60. Urinalysis was unremarkable. Dimario count was 11.9 with a hemoglobin of 8.8.   ASSESSMENT:  1.  Acute respiratory failure.  2.  Right lower lobe pneumonia with effusion.  3.  New onset congestive heart failure, presumably diastolic.  4.  Anemia of chronic disease.  5.  Metastatic pancreatic cancer.  6.  Diabetes, on insulin.  7.  Benign hypertension, stable.  8.  Chronic obstructive pulmonary disease.   PLAN: The patient will be admitted to the floor with telemetry as a full code at her request. She wants everything done. She will be started on oxygen with Xopenex and Atrovent SVNs and IV antibiotics. We will continue her Spiriva and Advair at this time. We will follow her sugars with Accu-Cheks before meals and at bedtime and add sliding scale insulin as needed. We will titrate her Lantus up at this time as her glucose is greater than 400. We will use morphine and Zofran as needed for pain and nausea. She was given IV Lasix x1 in the emergency room. We will hold off on any further Lasix at this time. We will follow up a chest x-ray and labs in the morning. We will obtain an echocardiogram as well. We will follow  serial cardiac enzymes. Consults from palliative care and oncology in the morning. We will wean oxygen as tolerated. She is on a 2 gram sodium, 1800 calorie ADA diet. Further treatment and evaluation will depend upon the patient's progress.   Total Time spent on this patient was 50 minutes.    ____________________________ Duane Lope Judithann Sheen, MD jds:th D: 07/06/2012 18:55:45 ET T: 07/06/2012 19:16:27 ET JOB#: [id number]  cc: Duane Lope. Judithann Sheen, MD, <Dictator> Duanne Limerick, MD Constantino Starace Rodena Medin MD ELECTRONICALLY SIGNED 07/07/2012 8:08

## 2014-10-03 NOTE — Discharge Summary (Signed)
PATIENT NAME:  Christy Wilson, [name] MR#:  [id number] DATE OF BIRTH:  1934-11-21  DATE OF ADMISSION:  09/20/2011 DATE OF DISCHARGE:  09/26/2011  HISTORY OF PRESENT ILLNESS: This 80 year old lady  was recently diagnosed to have carcinoma of the pancreas,  T1, N0, M0 tumor, diagnosis in April 2013. The patient was here to see Dr. Madaline BrilliantMosca but started having chills and high fever. The patient was admitted to the hospital with progressive jaundice, high fever, and abdominal discomfort. The patient had ERCP and stent placement.   The patient's past history, social and family history have been dictated and noted on the chart.   LABORATORY DATA: Available important lab data and x-ray data revealed on the day of admission Ritsema count was 10.2, hemoglobin 8.6, platelet count 285,000. Glucose 254. Bilirubin was 6.5. Alkaline phosphatase 489,000.   Blood cultures were positive for enterococci. Urine culture was positive for enterococci.   HOSPITAL COURSE: During the hospital stay, the patient was started on antibiotics, initially with Cipro and Flagyl and later on changed to vancomycin because of enterococci. The patient also had obstructive jaundice and was seen by GI in consultation with Dr. Lutricia FeilPaul Oh. The patient underwent ERCP and reinsertion of stent.    Gradually the patient's condition improved. At the time of discharge bilirubin was slowly coming down to 5.8. Degrazia count was 9.8, with hemoglobin 8. The patient was then discharged with advice to get followup. We discussed the possibility of options of chemotherapy and radiation therapy as a palliative approach.   The patient was agreeable to this.  Her general condition had improved. The patient was supposed to see Dr. Rushie Chestnuthrystal for radiation therapy evaluation.   FINAL DIAGNOSES:  1. Enterococcus septicemia secondary to urinary tract infection.  2. Obstructive jaundice with cholangitis.  3. Carcinoma of pancreas T1, N0, M0 tumor.  4. Diabetes,  insulin-dependent.   DISCHARGE MEDICATIONS:  1. Glyburide/metformin 5 mg/ 500 mg, 2 tablets 2 times a day.  2. Carvedilol 6.25 mg, 1 tablet 2 times a day. 3. Spiriva 18-mcg inhalation, 1 tablet daily.  4. Cardizem ER 300 mg, 1 tablet daily.  5. Aldactone 25 mg p.o. daily.  6. Advair 250 mcg/50 mcg, 1 puff q.12 hourly.  7. Nexium 40 mg daily.  8. Lantus 100 units, 36 units at bedtime.  9. Lasix 40 mg p.o. daily.   PROGNOSIS: Overall prognosis was guarded.   DIET: Diabetic diet.   ACTIVITY: As tolerated.      ____________________________ Gerome SamJanak K. Cisco Kindt, MD jkc:bjt D: 10/15/2011 12:34:15 ET T: 10/15/2011 12:55:56 ET JOB#: [id number]  cc: Valarie ConesJanak K. Doylene Canninghoksi, MD, <Dictator> Laddie AquasJANAK K Dede Dobesh MD ELECTRONICALLY SIGNED 10/16/2011 7:59

## 2014-10-03 NOTE — Consult Note (Signed)
Chief Complaint:   Subjective/Chief Complaint Feels well but T.bili going up now.   VITAL SIGNS/ANCILLARY NOTES: **Vital Signs.:   14-Apr-13 06:23   Vital Signs Type Q 4hr   Temperature Temperature (F) 98.8   Celsius 37.1   Temperature Source oral   Pulse Pulse 99   Pulse source per Dinamap   Respirations Respirations 18   Systolic BP Systolic BP 113   Diastolic BP (mmHg) Diastolic BP (mmHg) 74   Mean BP 87   BP Source Dinamap   Pulse Ox % Pulse Ox % 97   Pulse Ox Activity Level  At rest   Oxygen Delivery Room Air/ 21 %   Brief Assessment:   Cardiac Regular    Respiratory clear BS    Gastrointestinal Normal   Routine Hem:  14-Apr-13 07:02    Hemoglobin (CBC) 8.7  Hepatic:  14-Apr-13 07:02    Bilirubin, Total 12.2   Alkaline Phosphatase 504   SGPT (ALT) 42   SGOT (AST) 37   Total Protein, Serum 7.3   Albumin, Serum 2.6   Bilirubin, Direct 10.20   Assessment/Plan:  Assessment/Plan:   Assessment UTI. Jaundice. Pancreatic cancer.    Plan Will plan ERCP tomorrow to exchange stent to a bigger stent. Thanks.   Electronic Signatures: Lutricia Feilh, Alayia Meggison (MD)  (Signed 14-Apr-13 10:24)  Authored: Chief Complaint, VITAL SIGNS/ANCILLARY NOTES, Brief Assessment, Lab Results, Assessment/Plan   Last Updated: 14-Apr-13 10:24 by Lutricia Feilh, Sung Parodi (MD)

## 2014-10-03 NOTE — Consult Note (Signed)
Chief Complaint:   Subjective/Chief Complaint Feels better. No itching today. INR down to 1.3.   VITAL SIGNS/ANCILLARY NOTES: **Vital Signs.:   01-Mar-13 10:42   Vital Signs Type Q 4hr   Temperature Temperature (F) 98.3   Celsius 36.8   Temperature Source oral   Pulse Pulse 89   Pulse source per Dinamap   Respirations Respirations 20   Systolic BP Systolic BP 440   Diastolic BP (mmHg) Diastolic BP (mmHg) 69   Mean BP 79   BP Source Dinamap   Pulse Ox % Pulse Ox % 94   Pulse Ox Activity Level  At rest   Oxygen Delivery Room Air/ 21 %   Brief Assessment:   Cardiac Regular    Respiratory clear BS    Gastrointestinal Normal   Routine Coag:  01-Mar-13 04:49    Activated PTT (APTT) 38.5  Routine Hem:  01-Mar-13 04:49    WBC (CBC) 7.1   RBC (CBC) 2.97   Hemoglobin (CBC) 8.1   Hematocrit (CBC) 24.0   Platelet Count (CBC) 211   MCV 81   MCH 27.4   MCHC 33.9   RDW 21.2   Bands 3   Segmented Neutrophils 62   Lymphocytes 11   Monocytes 12   Eosinophil 9  Routine Chem:  01-Mar-13 04:49    Glucose, Serum 147   BUN 16   Creatinine (comp) 0.78   Sodium, Serum 138   Potassium, Serum 3.3   Chloride, Serum 100   CO2, Serum 26   Calcium (Total), Serum 8.6  Hepatic:  01-Mar-13 04:49    Bilirubin, Total 21.7   Alkaline Phosphatase 399   SGPT (ALT) 36   SGOT (AST) 46   Total Protein, Serum 6.9   Albumin, Serum 3.1  Routine Chem:  01-Mar-13 04:49    Osmolality (calc) 280   eGFR (African American) >60   eGFR (Non-African American) >60   Anion Gap 12  Routine Coag:  01-Mar-13 04:49    Prothrombin 16.9  Routine Hem:  01-Mar-13 04:49    Variant Lymphocytes 3  Routine Coag:  01-Mar-13 04:49    INR 1.3   Fibrinogen 558   Assessment/Plan:  Assessment/Plan:   Assessment Jaundice.    Plan Plan for ERCP later this afternoon. THanks.   Electronic Signatures: Verdie Shire (MD)  (Signed 01-Mar-13 12:36)  Authored: Chief Complaint, VITAL SIGNS/ANCILLARY NOTES, Brief  Assessment, Lab Results, Assessment/Plan   Last Updated: 01-Mar-13 12:36 by Verdie Shire (MD)

## 2014-10-03 NOTE — Consult Note (Signed)
Chief Complaint:   Subjective/Chief Complaint Pt without complaints. Cytology finally came back today. Results neg. Results of CT noted. Repeat LFT pending.   VITAL SIGNS/ANCILLARY NOTES: **Vital Signs.:   07-Mar-13 10:07   Temperature Temperature (F) 97.6   Celsius 36.4   Pulse Pulse 111   Pulse source per Dinamap   Respirations Respirations 20   Systolic BP Systolic BP 114   Diastolic BP (mmHg) Diastolic BP (mmHg) 78   Mean BP 90   Pulse Ox % Pulse Ox % 97   Pulse Ox Activity Level  At rest   Oxygen Delivery Room Air/ 21 %    11:47   Vital Signs Type POCT   Nurse Fingerstick (mg/dL) FSBS (fasting range 16-1065-99 mg/dL) [id number]   Comments/Interventions  Nurse Notified   Brief Assessment:   Cardiac Regular    Respiratory clear BS    Gastrointestinal Normal   Radiology Results: CT:    06-Mar-13 18:26, CT Abdomen With Contrast   CT Abdomen With Contrast    REASON FOR EXAM:    evaluate pancreas for mass  COMMENTS:       PROCEDURE: CT  - CT ABDOMEN STANDARD W  - Aug 15 2011  6:26PM     RESULT: History: Pancreatic mass    Comparison: None    Technique: Multiple axial images of the abdomen were performed from the   lung bases to the iliac crests, with p.o. contrast and with 100 ml of   Isovue 300 intravenous contrast.      Findings:  The lung bases are clear. There is no pneumothorax. The heart size is   normal.     The liver is diffusely low in attenuation. There is no intrahepatic or   extrahepatic biliary ductal dilatation. There is contrast present within   the gallbladder likely resenting refluxed contrast from the biliary   stent. There are cholelithiasis. There is a biliary stent present. There   is pneumobilia. The spleen demonstrates no focal abnormality. The kidneys   and adrenal glands are unremarkable. There is slight heterogeneity of the   pancreatic head without a definable pancreatic mass.    The visualized portions of the stomach, duodenum, small  intestine, and   large intestine demonstrate no contrast extravasation or dilatation.   There is no pneumoperitoneum, pneumatosis, or portal venous gas. There is   no abdominal free fluid. There is no lymphadenopathy.   The abdominal aorta is normal in caliber .    The osseous structures are unremarkable.    IMPRESSION:     1. Slight heterogeneity of the pancreatic head without a definable mass.   This may reflect a focal pancreatitis, but underlying mass cannot be   excluded. Endoscopic ultrasound may be helpful. Correlation with recent   endoscopic stent placement findings is recommended. There is a biliary   stent in place.    2. Cholelithiasis.    3. Hepatic steatosis.    Verified By: Joellyn HaffHETAL P. PATEL, M.D., MD   Assessment/Plan:  Assessment/Plan:   Assessment Painless jaundice. Relieved with stenting. Cytology neg but CT abnormal at head. CA 19-9 level high.    Plan Ok for discharge later today if LFT improving. Will discuss with Dr. Christella HartiganJacobs [facility] Will sign off. Thanks.   Electronic Signatures: Lutricia Feilh, Jossilyn Benda (MD)  (Signed 07-Mar-13 12:59)  Authored: Chief Complaint, VITAL SIGNS/ANCILLARY NOTES, Brief Assessment, Radiology Results, Assessment/Plan   Last Updated: 07-Mar-13 12:59 by Lutricia Feilh, Lucianna Ostlund (MD)

## 2014-10-03 NOTE — Consult Note (Signed)
Repeat ERCP done due to rising LFT. Apears that biliary stent had migrated distally. This stent plus PD stent removed with snare. Persistent distal CBD stricture present. New smaller4 7Fr but longer 7cm stent placed deeply into CBD. Hopefully, jaundice should start improving. Recommend CT of abd with thin cuts of pancreas to see if pancreatic mass present. Cytology brushings pending. Thanks.  Electronic Signatures: Lutricia Feilh, Jemarion Roycroft (MD)  (Signed on 04-Mar-13 15:17)  Authored  Last Updated: 04-Mar-13 15:17 by Lutricia Feilh, Corissa Oguinn (MD)

## 2014-10-03 NOTE — Consult Note (Signed)
Chief Complaint:   Subjective/Chief Complaint Overall feels OK. No abdominal pain or vomiting. Tolerating diet well.   VITAL SIGNS/ANCILLARY NOTES: **Vital Signs.:   03-Mar-13 04:46   Vital Signs Type Routine   Temperature Temperature (F) 97.9   Celsius 36.6   Temperature Source oral   Pulse Pulse 100   Pulse source per Dinamap   Respirations Respirations 19   Systolic BP Systolic BP 619   Diastolic BP (mmHg) Diastolic BP (mmHg) 79   Mean BP 92   BP Source Dinamap   Pulse Ox % Pulse Ox % 95   Pulse Ox Activity Level  At rest   Oxygen Delivery Room Air/ 21 %   Brief Assessment:   Additional Physical Exam Jaundiced. Obese. Abdomen is non tender.   Routine Coag:  03-Mar-13 05:48    Activated PTT (APTT) 30.3  Routine Hem:  03-Mar-13 05:48    WBC (CBC) 9.2   RBC (CBC) 3.21   Hemoglobin (CBC) 8.9   Hematocrit (CBC) 26.3   Platelet Count (CBC) 239   MCV 82   MCH 27.7   MCHC 33.8   RDW 21.7   Segmented Neutrophils 75   Lymphocytes 12   Monocytes 10   Eosinophil 3  Routine Chem:  03-Mar-13 05:48    Glucose, Serum 419   BUN 30   Creatinine (comp) 0.95   Sodium, Serum 137   Potassium, Serum 3.8   Chloride, Serum 100   CO2, Serum 24   Calcium (Total), Serum 9.3  Hepatic:  03-Mar-13 05:48    Bilirubin, Total 27.6   Alkaline Phosphatase 508   SGPT (ALT) 40   SGOT (AST) 42   Total Protein, Serum 7.1   Albumin, Serum 3.0  Routine Chem:  03-Mar-13 05:48    Osmolality (calc) 298   eGFR (African American) >60   eGFR (Non-African American) >60   Anion Gap 13  Routine Coag:  03-Mar-13 05:48    Prothrombin 14.5   INR 1.1  Blood Glucose:  03-Mar-13 08:05    POCT Blood Glucose 412    08:10    POCT Blood Glucose 409   Assessment/Plan:  Assessment/Plan:   Assessment Obstructive jaundice secondary to ? biliary stricture. Cytology pending. Bilirubin continues to rise and patient may need repositioning/replacement of biliary stent. No signs of cholangitis.     Plan Continue present management. Will make her NPO after midnight and discuss with Dr. Candace Cruise in am. Further recommendations per Dr. Candace Cruise. Discussed with Dr. Posey Pronto.   Electronic Signatures: Jill Side (MD)  (Signed 03-Mar-13 09:36)  Authored: Chief Complaint, VITAL SIGNS/ANCILLARY NOTES, Brief Assessment, Lab Results, Assessment/Plan   Last Updated: 03-Mar-13 09:36 by Jill Side (MD)

## 2014-10-03 NOTE — Consult Note (Signed)
Chief Complaint:   Subjective/Chief Complaint Feels well. Tolerating clear liquids. No abdominal pain or vomiting.   VITAL SIGNS/ANCILLARY NOTES: **Vital Signs.:   02-Mar-13 10:00   Pulse Pulse 88   Respirations Respirations 26   Systolic BP Systolic BP 967   Diastolic BP (mmHg) Diastolic BP (mmHg) 56   Mean BP 72   Pulse Ox % Pulse Ox % 96   Oxygen Delivery Room Air/ 21 %   Pulse Ox Heart Rate 88   Brief Assessment:   Additional Physical Exam Abdomen is soft and non tender.   Routine Coag:  02-Mar-13 04:47    Activated PTT (APTT) 29.7  Routine Hem:  02-Mar-13 04:47    WBC (CBC) 10.0   RBC (CBC) 3.14   Hemoglobin (CBC) 8.6   Hematocrit (CBC) 25.8   Platelet Count (CBC) 212   MCV 82   MCH 27.2   MCHC 33.1   RDW 21.0  Routine Chem:  02-Mar-13 04:47    Glucose, Serum 199   BUN 22   Creatinine (comp) 0.77   Sodium, Serum 141   Potassium, Serum 3.8   Chloride, Serum 101   CO2, Serum 20   Calcium (Total), Serum 8.7  Hepatic:  02-Mar-13 04:47    Bilirubin, Total 23.7   Alkaline Phosphatase 446   SGPT (ALT) 36   SGOT (AST) 52   Total Protein, Serum 7.0   Albumin, Serum 2.9  Routine Chem:  02-Mar-13 04:47    Osmolality (calc) 290   eGFR (African American) >60   eGFR (Non-African American) >60   Anion Gap 20  Routine Coag:  02-Mar-13 04:47    Prothrombin 15.3   Prothrombin -  Routine Chem:  02-Mar-13 04:47    Lipase 120  Hepatic:  02-Mar-13 04:47    Bilirubin, Direct 21.30  Routine Coag:  02-Mar-13 04:47    INR 1.2   INR -  Blood Glucose:  02-Mar-13 07:48    POCT Blood Glucose 205   Assessment/Plan:  Assessment/Plan:   Assessment Cholelithiasis. ? CBD stricture s/p cytology. CA19-9 is somewhat elevated but not high enough to make a defenitive diagnosis of malignancy. Bilirubin is higher today.    Plan Full liquid diet. Repeat LFT's in am. Will follow.   Electronic Signatures: Jill Side (MD)  (Signed 02-Mar-13 10:33)  Authored:  Chief Complaint, VITAL SIGNS/ANCILLARY NOTES, Brief Assessment, Lab Results, Assessment/Plan   Last Updated: 02-Mar-13 10:33 by Jill Side (MD)

## 2014-10-03 NOTE — Consult Note (Signed)
Difficult cannulation. PD 1 st cannulated. PD stent placed to get access to CBD. CBD narrow. Intrahepatic ducts all dilated. Biliary sphincterotomy done. Balloon sweep done. No stone. Cytology brushings done. Keep patient npo rest of today. Watch for pancretitis. CBD stent placed. Bile flowing now. Dr. Tami LinIfitikhar will see patient over the weekend. Thanks.  Electronic Signatures: Lutricia Feilh, Zafar Debrosse (MD)  (Signed on 01-Mar-13 15:43)  Authored  Last Updated: 01-Mar-13 15:43 by Lutricia Feilh, Olina Melfi (MD)

## 2014-10-03 NOTE — Consult Note (Signed)
Reason for Visit: This 80 year old Female patient presents to the clinic for initial evaluation of  Pancreatic cancer .   Referred by Dr. Doylene Canning.  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   Pathology Report Pathology report reviewed    Imaging Report CT scan reviewed    Referral Report Clinical notes reviewed    Planned Treatment Regimen IMRT radiation with concurrent chemotherapy    HPI   patient is a 80 year old female who presented with jaundice and was admitted to [facility]. She was noticed to have a mass in the head of the pancreas 1.2 x 1.6 cm. Mass was lying in the portal vein did not appear to invade. Biopsy was positive on ERCP for adenocarcinoma. She had a stent placed.initially was she was having significant fever chills and nausea and vomiting with no significant abdominal pain.she underwent EBUS showing diffuse biliary stricture. Some of her jaundice has cleared although she still has scleral icterus and some skin itching. She is having no significant abdominal pain. She is having a Port-A-Cath placed today and is now seen for consultation regarding concurrent chemotherapy and radiation. She has been evaluated by GI surgery based on her multiple comorbidities is not recommended for resection.  Past Hx:    COPD:    CHF:    diabetes:    htn, diabetes:    left THR:    hysterectomy:   Past, Family and Social History:   Past Medical History positive    Cardiovascular congestive heart failure; hypertension; peripheral vascular disease; Significant lower extremity edema    Respiratory COPD    Endocrine diabetes mellitus    Past Surgical History Hysterectomy    Family History positive    Family History Comments Family history of coronary disease and  myocardial infarction no history of cancer    Social History noncontributory    Additional Past Medical and Surgical History Accompanied by 2 daughters today   Allergies:   Penicillin: Rash  Home Meds:  Home Medications: Medication Instructions Status  promethazine 25 mg tablet 1 tab(s) orally every 6 hours as needed for nausea vomiting Active  glyBURIDE-metformin 5 mg-500 mg oral tablet 2 tab(s) orally 2 times a day Active  carvedilol 6.25 mg oral tablet 1 tab(s) orally 2 times a day Active  Spiriva 18 mcg inhalation capsule Inhale 1 cap(s) via handihaler once a day as needed. **rx written for 1 cap via handihaler once a day** Active  Diltiazem Hydrochloride ER 300 mg/24 hours oral capsule, extended release 1 cap(s) orally once a day Active  spironolactone 25 mg oral tablet 1 tab(s) orally once a day Active  Advair Diskus 250 mcg-50 mcg inhalation powder 1 puff(s) inhaled 2 times a day Active  Nexium 40 mg oral delayed release capsule 1 cap(s) orally once a day Active  ProAir HFA 90 mcg/inh inhalation aerosol 1 puff(s) inhaled once a day as needed as directed by physician. Active  Lantus 100 units/mL subcutaneous solution Inject 36 unit(s) subcutaneous once a day (at bedtime) as directed by physician. Active  furosemide 40 mg oral tablet 1 tab(s) orally 2 times a day Active  setan plus 1 tab(s) orally 2 times a day Active  ProAir HFA 90 mcg/inh 1 puff(s) inhaled once a day as needed as directed by your physician Active   Review of Systems:  General negative    Performance Status (ECOG) 1    Skin negative    Breast negative    Ophthalmologic negative    ENMT negative    Respiratory and Thorax negative    Cardiovascular see HPI    Gastrointestinal see HPI    Genitourinary negative    Musculoskeletal negative    Neurological negative    Psychiatric negative    Hematology/Lymphatics negative    Endocrine see HPI    Allergic/Immunologic negative   Nursing  Notes:  Nursing Vital Signs and Chemo Nursing Nursing Notes: *CC Vital Signs Flowsheet:   29-Apr-13 08:39   Pulse Pulse 69   Respirations Respirations 20   SBP SBP 103   DBP DBP 66   Pain Scale (0-10)  1   Current Weight (kg) (kg) 109.8   Height (cm) centimeters 164.3   BSA (m2) 2.1   Physical Exam:  General/Skin/HEENT:   General normal    ENMT normal    Head and Neck normal    Additional PE Well-developed female wheelchair-bound in NAD. She does have moderate scleral icterus. Lungs are clear to A&P cardiac examination shows regular rate and rhythm. She has significant bilateral lower extremity edema. Abdomen is benign. Positive bowel sounds all 4 quadrants. No pain is elicited on deep palpation of her abdomen.   Breasts/Resp/CV/GI/GU:   Respiratory and Thorax normal    Cardiovascular normal    Gastrointestinal normal    Genitourinary normal   MS/Neuro/Psych/Lymph:   Musculoskeletal normal    Neurological normal    Lymphatics normal   Other Results:  Radiology Results: CT:    06-Mar-13 18:26, CT Abdomen With Contrast   CT Abdomen With Contrast    REASON FOR EXAM:    evaluate pancreas for mass  COMMENTS:       PROCEDURE: CT  - CT ABDOMEN STANDARD W  - Aug 15 2011  6:26PM     RESULT: History: Pancreatic mass    Comparison: None    Technique: Multiple axial images of the abdomen were performed from the   lung bases to the iliac crests, with p.o. contrast and with 100 ml of   Isovue 300 intravenous contrast.      Findings:  The lung bases are clear. There is no pneumothorax. The heart size is   normal.     The liver is diffusely low in attenuation. There is no intrahepatic or   extrahepatic biliary ductal dilatation. There is contrast present within   the gallbladder likely resenting refluxed contrast from the biliary   stent. There are cholelithiasis. There is a biliary stent present. There   is pneumobilia. The spleen demonstrates no focal  abnormality. The kidneys   and adrenal glands are unremarkable. There is slight heterogeneity of the   pancreatic head without a definable pancreatic mass.    The visualized portions of the stomach, duodenum, small intestine, and   large intestine demonstrate no contrast extravasation or dilatation.   There is no pneumoperitoneum, pneumatosis, or portal venous gas. There is   no abdominal free fluid. There is no lymphadenopathy.   The abdominal aorta is normal in caliber .    The osseous structures are unremarkable.    IMPRESSION:     1. Slight heterogeneity of the pancreatic head without a definable mass.   This may reflect a focal pancreatitis, but underlying mass cannot be   excluded. Endoscopic ultrasound may be helpful. Correlation with recent   endoscopic stent placement findings is  recommended. There is a biliary   stent in place.    2. Cholelithiasis.    3. Hepatic steatosis.    Verified By: Joellyn Haff, M.D., MD   Assessment and Plan:  Impression:   clinical stage I adenocarcinoma head of the pancreas presenting as painless jaundice in 80 year old female. Patient is nonoperable based on multiple medical comorbidities  Plan:   at this time based on her inability to undergo extensive surgery based on multiple medical comorbidities would recommend IMRT radiation therapy along with radiation sensitizing chemotherapy. I have been using5400 cGy in 30 fractions using IMRT to spare critical structures such as spinal cord small bowel and liver. this dose is commonly been use now in the medical literature and is been well-tolerated my prior patients.Risks and benefits of treatment including possibility of diarrhea, nausea and irritation of small bowel were all reviewed in depth with the patient and her family. I have set her up for CT simulation the day after tomorrow to let him recuperate somewhat from her Port-A-Cath placement.  I would like to take this opportunity to thank you  for allowing me to continue to participate in this patient's care.  CC Referral:   cc: Dr. Elizabeth Sauer   Electronic Signatures: Rushie Chestnut, Gordy Councilman (MD)  (Signed 29-Apr-13 13:25)  Authored: HPI, Diagnosis, Past Hx, PFSH, Allergies, Home Meds, ROS, Nursing Notes, Physical Exam, Other Results, Encounter Assessment and Plan, CC Referring Physician   Last Updated: 29-Apr-13 13:25 by Rebeca Alert (MD)

## 2014-10-03 NOTE — Consult Note (Signed)
Chief Complaint:   Subjective/Chief Complaint See Dawn Harrison's notes from yest. Feels better.   VITAL SIGNS/ANCILLARY NOTES: **Vital Signs.:   13-Apr-13 09:54   Vital Signs Type Q 4hr   Temperature Temperature (F) 98.2   Celsius 36.7   Temperature Source oral   Pulse Pulse 93   Pulse source per Dinamap   Respirations Respirations 18   Systolic BP Systolic BP 845   Diastolic BP (mmHg) Diastolic BP (mmHg) 75   Mean BP 87   BP Source Dinamap   Pulse Ox % Pulse Ox % 95   Pulse Ox Activity Level  At rest   Oxygen Delivery Room Air/ 21 %    11:55   Vital Signs Type POCT   Nurse Fingerstick (mg/dL) FSBS (fasting range 65-99 mg/dL) 255   Comments/Interventions  Nurse Notified   Brief Assessment:   Cardiac Regular    Respiratory clear BS    Gastrointestinal Normal   Routine Hem:  13-Apr-13 07:03    WBC (CBC) 8.2   RBC (CBC) 3.18   Hemoglobin (CBC) 8.6   Hematocrit (CBC) 26.7   Platelet Count (CBC) 292   MCV 84   MCH 27.0   MCHC 32.1   RDW 19.8   Neutrophil % 83.3   Lymphocyte % 3.7   Monocyte % 11.9   Eosinophil % 0.7   Basophil % 0.4   Neutrophil # 6.8   Lymphocyte # 0.3   Monocyte # 1.0   Eosinophil # 0.1   Basophil # 0.0  Routine Chem:  13-Apr-13 07:03    Glucose, Serum 240   BUN 13   Creatinine (comp) 0.74   Sodium, Serum 134   Potassium, Serum 3.9   Chloride, Serum 98   CO2, Serum 24   Calcium (Total), Serum 8.6  Hepatic:  13-Apr-13 07:03    Alkaline Phosphatase 478   SGPT (ALT) 42   SGOT (AST) 46   Total Protein, Serum 7.0   Albumin, Serum 2.8  Routine Chem:  13-Apr-13 07:03    Osmolality (calc) 276   eGFR (African American) >60   eGFR (Non-African American) >60   Anion Gap 12  Blood Glucose:  13-Apr-13 11:55    POCT Blood Glucose 255   Assessment/Plan:  Assessment/Plan:   Assessment Pancreatic cancer. UTI, likely cause of fever.    Plan Continue Abx. LFT elevated but compared to prev hospitalization, much improved. If t.bili  still too high to start chemo, then may exchange biliary stent during current hospitalization. Repeat ERCP originally scheduled next month. Will follow. Thanks.   Electronic Signatures: Verdie Shire (MD)  (Signed 13-Apr-13 12:19)  Authored: Chief Complaint, VITAL SIGNS/ANCILLARY NOTES, Brief Assessment, Lab Results, Assessment/Plan   Last Updated: 13-Apr-13 12:19 by Verdie Shire (MD)

## 2014-10-03 NOTE — H&P (Signed)
PATIENT NAME:  Christy Wilson, [name] MR#:  [id number] DATE OF BIRTH:  Oct 13, 1934  DATE OF ADMISSION:  08/09/2011  PRIMARY CARE PHYSICIAN: Dr. Elizabeth Sauereanna Jones   HISTORY OF PRESENT ILLNESS: Patient is a 80 year old African American female with past medical history significant for history of congestive heart failure, chronic, diastolic, hypertension, as well as medical noncompliance, atrial fibrillation, chronic obstructive pulmonary disease presented to the hospital with complaints of rash as well as patient's primary care physician sending her to Emergency Room for further evaluation. Apparently patient was doing well up until approximately a week ago when she started itching all over her body. She thought that it was rash, kind of little bumps all over her body. She went to see her primary care physician, Dr. Yetta BarreJones, who did lab studies and she was noted to have markedly elevated total bilirubin, jaundice and because of that jaundice patient was sent to Emergency Room for further evaluation. In the Emergency Room she had ultrasound of her abdomen done which showed stone in common bile duct with dilatation of common bile duct above 12 mm. Hospitalist services were contacted for admission. Patient herself did not notice much that she was yellowish, however, her family states that she was noted to have somewhat yellowish on Sunday, which was 3 or 4 days ago. She did not notice any changes in her urine or her stool color. She admitted though of having some pain but mostly in her knees and denied any abdominal discomfort. Admits of having some diarrhea over the past few days but no nausea or vomiting or no abdominal pains and no fevers.   PAST MEDICAL HISTORY:  1. History of congestive heart failure, left heart, chronic, diastolic. 2. History of hypertension. 3. Neuropathy. 4. History of chronic obstructive pulmonary disease and bronchitis, admission for the same in April 2012.  5. Diabetes, insulin-dependent.    MEDICATIONS: 1. Advair Diskus 250/50 twice daily.  2. Benicar 20 mg p.o. daily.  3. Carvedilol 6.25 mg p.o. twice daily. 4. Diltiazem 300 mg p.o. daily.  5. Furosemide 40 mg p.o. twice daily. 6. Glyburide metformin 5/500, 2 tablets twice daily. 7. Lantus 36 units at bedtime.  8. Lisinopril 20 mg p.o. daily.  9. Nexium 40 mg p.o. daily.  10. Potassium chloride 20 mg p.o. daily.  11. ProAir HFA 1 puff once daily as needed.  12. Spiriva once daily.  13. Spironolactone 25 mg p.o. daily.  14. Tylenol 500 mg p.o. daily as needed.  15. Warfarin 5 mg p.o. daily.   ALLERGIES: Penicillin.   FAMILY HISTORY: Patient's father had myocardial infarction.   SOCIAL HISTORY: Patient is retired. Lives at home. No smoking or alcohol consumption.   REVIEW OF SYSTEMS: CONSTITUTIONAL: Positive for having pains in her knees as well as sinus congestion, however, patient tells me that whenever she blows her nose she would have some blood in her secretions for the past one week, however, denies any hemoptysis. Admits of having shortness of breath which seemed to be chronic as well as intermittent diarrhea over the past few days as well as itching and scratching of her skin which she thought it was a rash. Denies any fevers, chills, fatigue, weakness, weight loss or gain. EYES: In regards to eyes denies any blurry vision, double vision, glaucoma or cataracts. ENT: Denies any tinnitus, allergies, epistaxis, sinus pain, dentures, difficulty swallowing. RESPIRATORY: Denies any cough, wheeze, asthma, chronic obstructive pulmonary disease. CARDIOVASCULAR: Denies chest pains, orthopnea, edema, arrhythmias, palpitations, or syncope. GASTROINTESTINAL: Denies nausea, vomiting,  hematemesis, change in bowel habits. GENITOURINARY: Denies dysuria, hematuria, frequency, or incontinence. ENDOCRINOLOGY: Denies polydipsia, nocturia, thyroid problems, heat or cold intolerance or thirst. HEMATOLOGIC: Denies anemia, easy bruising,  bleeding, swollen glands. SKIN: Denies any acne, rashes, change in moles. MUSCULOSKELETAL: Denies arthritis, cramps, swelling, gout. NEUROLOGICAL: Denies any numbness, epilepsy, tremor. PSYCH: No anxiety, insomnia, depression.   PHYSICAL EXAMINATION:  VITAL SIGNS: On arrival to the hospital: Temperature 98, pulse 92, respiration rate 18, blood pressure 151/85, saturation 100% on room air.   GENERAL: This is well-nourished obese African American female not in distress lying on the stretcher.   HEENT: Her pupils are equal, reactive to light. Extraocular movements are intact. She has significant icterus. No conjunctivitis. Normal hearing. No pharyngeal erythema. Mucosa is dry.   NECK: Neck did not reveal any masses, supple, nontender. Thyroid not enlarged. No adenopathy. No JVD or carotid bruits bilaterally. Full range of motion.   LUNGS: Clear to auscultation though diminished breath sounds bilaterally. No rales, rhonchi, or wheezing. No labored inspirations, increased effort, dullness to percussion, overt respiratory distress.   CARDIOVASCULAR: S1, S2 appreciated. No murmurs, gallops or rubs noted. PMI not lateralized. Chest is nontender to palpation.   EXTREMITIES: 1+ pedal pulses. No lower extremity edema, calf tenderness, or cyanosis noted.   ABDOMEN: Soft. No significant tenderness. No hepatosplenomegaly or masses were noted.   RECTAL: Deferred.   MUSCULOSKELETAL: Able to move all extremities. No cyanosis, degenerative joint disease or kyphosis. Gait is not tested.  SKIN: Skin did not reveal any rashes, lesions, erythema, nodularity, induration. She has some scratch marks on the upper chest, neck area but no nodularity, induration. Skin was warm and dry to palpation.   LYMPH: No adenopathy in cervical region.   NEUROLOGIC: Cranial nerves grossly intact. Sensory is intact. No dysarthria, aphasia.   PSYCH: Patient is alert, oriented to time, person, place, cooperative. Memory is good.  No significant confusion, agitation, depression noted.   LABORATORY, DIAGNOSTIC, AND RADIOLOGICAL DATA: BMP within normal limits. LDH is high at 302, total bilirubin 19.9, alkaline phosphatase 490, AST elevated to 53. CBC: [place] blood cell count 6.7, hemoglobin 9.1, platelets 243. Coagulation panel: Pro time more than 120. INR is not checked, however, activated PTT more than 160. Urinalysis showed amber clear urine, negative for glucose, 1+ bilirubin, negative for ketones, specific gravity 1.005, pH 5.0, negative for blood, protein, nitrites, or leukocyte esterase, 1 red blood cell, no Napoleon blood cells, 2+ bacteria, 1 epithelial cell was noted. Mucus was present as well as 3 hyaline casts and amorphous crystals.   Ultrasound of abdomen, general survey 08/09/2011 showed cholelithiasis with associated thickening of gallbladder wall as well as dilatation of common bile duct. Additionally, there is dilated intrahepatic duct which suggests partial common bile duct obstruction. There is increased echogenicity throughout gallbladder most compatible with gallbladder sludge. No ascites is seen according to radiologist.   Patient's EKG just now was done and it showed atrial flutter with variable AV block at 69 beats per minute, normal axis, nonspecific ST-T changes.   ASSESSMENT AND PLAN:  1. Choledocholithiasis. Admit patient to medical floor. Start her on IV fluids as well as antibiotic therapy. Gastroenterology consultation will be requested. Surgery already saw patient, however, patient needs to have reversed Coumadin for any procedure whether it be ERCP or laparoscopic cholecystectomy.  2. Coagulopathy, acquired. Will give patient vitamin K and transfuse fresh frozen plasma. Will check pro time level in the morning.  3. Questionable cholecystitis, likely chronic. Will start Levaquin IV.  4. Chronic obstructive pulmonary disease. Seems to be stable. Will continue patient on Advair Diskus as well as DuoNebs.   5. Hypertension. Will continue Coreg as well as Benicar and Cardizem.  6. Diabetes mellitus. Will give patient lower dose of Lantus as well as sliding scale insulin as patient will be on clear liquid diet at least until tomorrow when she will hopefully be able to get ERCP. This was discussed with patient.   TIME SPENT: One hour.   ____________________________ Katharina Caper, MD rv:cms D: 08/09/2011 14:09:59 ET T: 08/09/2011 14:41:50 ET  JOB#: [id number] cc: Katharina Caper, MD, <Dictator> Duanne Limerick, MD Freddie Nghiem MD ELECTRONICALLY SIGNED 08/24/2011 20:51

## 2014-10-03 NOTE — Consult Note (Signed)
Brief Consult Note: Diagnosis: obstructive jaundice, severe over-anticoagulation.   Patient was seen by consultant.   Consult note dictated.   Recommend further assessment or treatment.   Orders entered.   Comments: The pt's obstructive jaundice may be secondary to a CBD stone (as she has gallstones) or a mass in the CBD and/or pancreas. Agree w/ 6 unit FFP and 10 mg IV Vit K. I ordered coags for next few days and CEA and CA 19-9 for tomorrow. CA 19-9 can be somewhat elevated from jaundice but if very elevated (or if CEA is elevated), that would suggest a malignant cause. I also ordered two more days of IV Vitamin K. Ultimately, she will need a triple phase CT or MRCP and possibly an ERCP.  Dr Excell Seltzerooper taking over tomorrow for us.  Electronic Signatures: Claude MangesMarterre, Mordechai Matuszak F (MD)  (Signed [phone]-Feb-13 16:22)  Authored: Brief Consult Note   Last Updated: 28-Feb-13 16:22 by Claude MangesMarterre, Field Staniszewski F (MD)

## 2014-10-03 NOTE — Consult Note (Signed)
PATIENT NAME:  Christy Wilson, Christy Wilson MR#:  [id number] DATE OF BIRTH:  1935-01-19  DATE OF CONSULTATION:  08/09/2011  REFERRING PHYSICIAN:  Katharina Caper, MD CONSULTING PHYSICIAN:  Rodman Key, NP/Paul Oh, MD   PRIMARY CARE PHYSICIAN:  Dr. Elizabeth Sauer.   REASON FOR CONSULTATION: Jaundice and gallstones.    HISTORY: Ms. Bucher is a 80 year old African American female with significant past medical history of congestive heart failure, chronic diastolic hypertension, as well as medical noncompliance, atrial fibrillation, chronic obstructive pulmonary disease. The patient presented to [facility] Emergency Room at the request of her primary care provider for the concern of jaundice. The patient had been doing well up until a week ago when she started experiencing itching generalized. She thought it was a rash as she had "little bumps all over her body". She was seen by Dr. Elizabeth Sauer who did laboratory studies and noted marked elevation in total bilirubin as well as evidence of jaundice.   The patient denies any abdominal pain. No nausea. No vomiting. No fevers. No change in bowel habits. She has had mild diarrhea. Normal bowel pattern is usually every other day. Denies color changes though with feces. No rectal bleeding. No melena. No history of jaundice in the past. On admission, total protein was 7.2 with an albumin of 2.9, total bilirubin is 19.9 with a direct of 16.70, alkaline phosphatase is 490, AST is 53 with an ALT of 42. Hemoglobin 9.1, hematocrit 27.2 with an RBC of 3.37. PT is greater than 120 with a PTT of greater than 160. The patient is normally on warfarin 5 mg 1 tablet daily for management of atrial fibrillation. She is followed by  Rudi Coco, NP. The last dose of warfarin was Tuesday night.   Abdominal ultrasound revealed spleen abdominal aorta. Inferior vena cava showing no significant abnormalities. The head and the body of the pancreas are visualized and normal  in appearance. The pancreatic tail is obscured. There are noted dilated intrahepatic bile ducts. Hepatic echo pattern is otherwise normal in appearance. There is observed a shadowing echodensity in the gallbladder compatible with a gallstone. There is echogenic material throughout the gallbladder most compatible with sludge. There is thickening of the gallbladder wall measuring 4.5 mm in diameter. The common bile duct is dilated measuring 12.12 mm in maximum diameter. There was observed dilatation of the intrahepatic ducts. Findings suggestive of common bile duct obstruction. The right and left kidneys are visualized and no evidence of ascites.   MEDICATIONS AT HOME:  1. Advair Diskus 250/50 twice a day. 2. Benicar 20 mg daily. 3. Carvedilol 6.25 mg twice a day. 4. Diltiazem 300 mg a day. 5. Furosemide 40 mg twice a day. 6. Glipizide metformin 5/500 milligrams 2 tablets twice daily.  7. Lantus 36 units at bedtime. 8. Lisinopril 20 mg a day. 9. Nexium 40 mg a day.  10. Potassium chloride 20 mg a day. 11. ProAir HFA one puff daily as needed. 12. Spiriva once a day.  13. Spironolactone 25 mg a day. 14. Tylenol 500 mg daily as needed. 15. Warfarin 5 mg a day.   ALLERGIES: Penicillin.   PAST MEDICAL HISTORY:  1. Congestive heart failure, left heart, chronic diastolic. 2. Hypertension. 3. Neuropathy. 4. Atrial fibrillation followed by Rudi Coco, NP. 5. History of chronic obstructive pulmonary disease with bronchitis on admission April 2012.  6. Diabetes mellitus, insulin-dependent.   PAST SURGICAL HISTORY: Hysterectomy.   FAMILY HISTORY: Mother with history of bowel blockage at the  age of 78. Father deceased at 7685, unknown cause. Multiple siblings, one brother with a history of myocardial infarction.   SOCIAL HISTORY: Divorced. No children, retired from [employer]. Close to her niece. Only smoked as a teenager for a short period of time. Drinks either wine or beer once a week. Minimal  caffeine.  REVIEW OF SYSTEMS: Significant for pain to her knees as well as sinus congestion. Does occasionally notice blood with blowing her nose. Denies hemoptysis. Some shortness of breath which is chronic. Intermittent diarrhea as previously stated as well as generalized itching, thinking it was related to a rash. Other than what is stated, all 10 systems reviewed and checked and otherwise unremarkable.   PHYSICAL EXAMINATION:  VITAL SIGNS: Temperature 98.8 with a pulse of 92, respirations 18, blood pressure 106/68, pulse oximetry 98%.   GENERAL: Well developed, well nourished 80 year old [place] female, no acute distress noted. In fact eating clear liquid diet without any difficulty.   HEENT: Normocephalic, atraumatic. Pupils equal, reactive to light. Conjunctivae clear. Sclerae icteric, evidence of jaundice.   NECK: Supple. Trachea midline. No lymphadenopathy or thyromegaly.   PULMONARY: Symmetric rise and fall of chest. Breath sounds slightly decreased but no adventitious sounds.   CARDIOVASCULAR: Regular rate and rhythm, S1, S2. No murmurs. No gallops.   ABDOMEN: Soft, nondistended. Bowel sounds in four quadrants. No bruits. No masses. No evidence of hepatosplenomegaly.   RECTAL: Deferred.   MUSCULOSKELETAL: Moving all four extremities. No contractures. No clubbing.   EXTREMITIES: +1 pedal pulses. No edema.   SKIN: No evidence of rashes, lesions, evidence of jaundice.   NEUROLOGICAL: No gross neurological deficits.   PSYCH: Alert and oriented x4. Memory grossly intact.   LABORATORY/DIAGNOSTIC: See laboratory studies as well as imaging findings as stated under history.   IMPRESSION:  1. Acute onset of jaundice without evidence of abdominal pain, nausea, essentially asymptomatic. Very elevated total bilirubin with gross elevation and direct bilirubin.  2. Anemia.  3. Known history of congestive heart failure as well as atrial fibrillation. Overcoagulability state in  which the patient is receiving FFP.  4. Abnormal gastrointestinal findings on abdominal ultrasound as stated under history. Dilatation of common bile duct with presentation of obstructive process possibly related to common bile duct stone though malignancy is of concern as well.   PLAN:  1. The patient's presentation was discussed with Dr. Lutricia FeilPaul Oh.  2. Would like to proceed with ERCP tomorrow to allow further evaluation. Concern does exist though given her coagulability state today. Will re-evaluate PT-INR in the morning and decision will be made by Dr. Bluford Kaufmannh at that time.  3. Agreement with continuation of antibiotic therapy, currently receiving Levaquin IV.  4. Will continue to monitor other laboratory studies pending CA-19-9 as well as CEA.  5. The patient is to remain on clear liquid diet at this time.   These services are provided by Owens Sharkawn Shabria Egley, NP under collaborative agreement with Dr. Lutricia FeilPaul Oh.   ____________________________ Rodman Keyawn S. Kincade Granberg, NP dsh:ap D: 08/09/2011 18:56:40 ET T: 08/10/2011 09:45:36 ET JOB#: [id number]  cc: Rodman Keyawn S. Bella Brummet, NP, <Dictator> Rodman KeyAWN S Marcile Fuquay MD ELECTRONICALLY SIGNED 08/10/2011 15:32

## 2014-10-03 NOTE — Consult Note (Signed)
Chief Complaint:   Subjective/Chief Complaint Overall better. Urine less dark. No abd pain or nausea. Cytology still pending.   VITAL SIGNS/ANCILLARY NOTES: **Vital Signs.:   06-Mar-13 13:34   Temperature Temperature (F) 97.8   Celsius 36.5   Temperature Source oral   Pulse Pulse 124   Respirations Respirations 20   Systolic BP Systolic BP 222   Diastolic BP (mmHg) Diastolic BP (mmHg) 72   Mean BP 86   Pulse Ox % Pulse Ox % 98   Pulse Ox Activity Level  At rest   Oxygen Delivery 1/2   Brief Assessment:   Cardiac Regular    Respiratory clear BS    Gastrointestinal Normal   Routine Chem:  06-Mar-13 04:09    Glucose, Serum 234   BUN 18   Creatinine (comp) 0.74   Sodium, Serum 140   Potassium, Serum 3.8   Chloride, Serum 101   CO2, Serum 28   Calcium (Total), Serum 8.8  Hepatic:  06-Mar-13 04:09    Bilirubin, Total 14.4   Alkaline Phosphatase 382   SGPT (ALT) 35   SGOT (AST) 19   Total Protein, Serum 6.6   Albumin, Serum 2.7  Routine Chem:  06-Mar-13 04:09    Osmolality (calc) 289   eGFR (African American) >60   eGFR (Non-African American) >60   Anion Gap 11   Assessment/Plan:  Assessment/Plan:   Assessment Jaundice from biliary obstruction. Finally improving from biliary stenting.    Plan Cytology still pending. Will order CT of abd to evaluate pancreas for pancreatic mass. If LFT continues to drop, patient can be discharged tomorrow. If cytology neg, may need EUS with Dr. Ardis Hughs as outpt later. Thanks.   Electronic Signatures: Verdie Shire (MD)  (Signed 06-Mar-13 14:50)  Authored: Chief Complaint, VITAL SIGNS/ANCILLARY NOTES, Brief Assessment, Lab Results, Assessment/Plan   Last Updated: 06-Mar-13 14:50 by Verdie Shire (MD)

## 2014-10-03 NOTE — Consult Note (Signed)
ERCP performed. Old stent had migrated. New 10 Fr x 7 cm stent placed. Pus drained. Looks like patient had both UTI and cholangitis present. Would treat enterococcus in urine. Would continue cipro bid x 5days for bile duct coverage as well. [place] [facility] [place]. If patient still here on Wed, will check back. Thanks.  Electronic Signatures: Lutricia Feilh, Amorah Sebring (MD)  (Signed on 15-Apr-13 13:45)  Authored  Last Updated: 15-Apr-13 13:45 by Lutricia Feilh, Jan Walters (MD)

## 2014-10-03 NOTE — Consult Note (Signed)
Pt seen and examined. See Dawn Harrison's notes. Jaundice with gallstones. Must r/o CBD stone/s. However, must also r/o malignancy as cause of jaundice. ERCP once INR <1.5. Will follow. Thanks.  Electronic Signatures: Lutricia Feilh, Jaiyanna Safran (MD)  (Signed on 28-Feb-13 21:31)  Authored  Last Updated: 28-Feb-13 21:31 by Lutricia Feilh, Philipp Callegari (MD)

## 2014-10-03 NOTE — Consult Note (Signed)
PATIENT NAME:  STEPHEN, TURNBAUGH MR#:  [id number] DATE OF BIRTH:  May 22, 1935  DATE OF CONSULTATION:  08/09/2011  REFERRING PHYSICIAN:   CONSULTING PHYSICIAN:  Claude Manges, MD  REASON FOR CONSULTATION:  Obstructive jaundice.   HISTORY OF PRESENT ILLNESS: Ms. Daidone was seen by her primary care physician, Dr. Yetta Barre, for a one week history of pruritus and was noted to have high bilirubin and further evaluation in the Emergency Room revealed that she had a dilated common bile duct and a bilirubin of 20. Alkaline phosphatase is also elevated at 490 and SGOT is mildly elevated at 53. Albumin is low at 2.9 and lipase is normal. The patient was also noted to have a PT greater than 120 and a PTT greater than 160. She has not had any bleeding. An ultrasound of the abdomen showed cholelithiasis with a 12 mm common bile duct and a thickened gallbladder wall at 4.5 mm. There was no filling defect or shadowing stone within the common bile duct and no mention is made of any masses in the pancreas.   I attempted to obtain a history from the patient, but, unfortunately, the patient feels well and has no pain, no nausea, and is not aware that her urine has changed in color and is not aware that her stool has changed in color or her sclerae have become yellow. She has 6 units of fresh frozen plasma ordered and has already received 10 mg IV vitamin K for her overanticoagulation.   PAST MEDICAL HISTORY:  1. Chronic diastolic left heart congestive heart failure. 2. Hypertension. 3. Neuropathy. 4. Chronic obstructive pulmonary disease.  5. Bronchitis.  6. Insulin-dependent diabetes mellitus.  7. Obesity.   MEDICATIONS:  1. Advair Diskus. 2. Benicar. 3. Carvedilol. 4. Diltiazem. 5. Furosemide. 6. Glyburide. 7. Metformin. 8. Lantus. 9. Lisinopril. 10. Nexium.  11. Potassium chloride. 12. ProAir HFA.  13. Spiriva. 14. Spironolactone.  15. Tylenol. 16. Coumadin (5 mg daily).   ALLERGIES: Penicillin.    FAMILY HISTORY: Noncontributory.   SOCIAL HISTORY: The patient is retired and lives alone. She does not smoke cigarettes and does not drink alcohol.   REVIEW OF SYSTEMS: Negative for 10 systems except she has had some bloody nasal discharge. She specifically denies hemoptysis, hematemesis, hematochezia, or any other bleeding symptoms. As mentioned in the history of present illness, the patient's ability to give a history is quite limited.   PHYSICAL EXAMINATION:  GENERAL: Pleasant, obese, black female who is lying in the stretcher in no apparent distress. Height 5 feet 7 inches, weight 240 pounds, BMI 37.6.  VITAL SIGNS:  Afebrile, pulse 70, respirations 18, blood pressure 98/57, oxygen saturation 100% on room air.   HEENT: Pupils equally round and reactive to light. Extraocular movements intact. Sclerae show frank icterus. Mucous membranes are moist and oropharynx is clear.   NECK: The trachea is midline with no jugular venous distention. There is no lymphadenopathy.   HEART: Regular rate and rhythm with no murmurs or rubs.   LUNGS: Clear to auscultation with normal respiratory effort bilaterally.   ABDOMEN: Obese, soft, nontender, and nondistended with no palpable hepatosplenomegaly or other masses.   EXTREMITIES: No edema with normal capillary refill bilaterally.   NEUROLOGIC: Cranial nerves II through XII, motor and sensation grossly intact.   PSYCHIATRIC: Alert and oriented x4 with appropriate affect and poor insight.   LABORATORY, RADIOLOGICAL AND DIAGNOSTIC DATA: Electrolytes normal. Anemia and significant jaundice by CBC and hepatic profile. Significant overanticoagulation with PT greater than 120  and PTT greater than 160. Ultrasound as mentioned in the history of present illness.   ASSESSMENT:  1. Obstructive jaundice. Since the patient has gallstones, it is possible that she has choledocholithiasis, but no common bile duct stone was seen on the ultrasound. Therefore, the  possibilities of pancreatic mass or common bile duct mass also exist. Since the patient is adequately hydrated and has normal renal function, she could undergo triple phase CT scan of the upper abdomen or perhaps would be best served with a MRCP to evaluate the common bile duct as well as the pancreas. If an abnormality is found on one of these studies an ERCP might further delineate the anatomy and/or be able to obtain biopsies or brushings or sphincterotomy with common bile duct stone removal. In that case, a gastroenterology consultation should be obtained. I have ordered a CEA and CA-19-9 today but CA-19-9 can be elevated fairly significantly in the setting of obstructive jaundice even when the etiology is benign.  2. Significant overanticoagulation. I agree with her receiving 6 units of fresh frozen plasma tonight and the 10 mg IV of vitamin K that she has already received. In all likelihood, she has been compliant with her Coumadin as her obstructive jaundice has developed over some period of time and she has experienced unopposed Coumadin absorption without any absorption of vitamin K (which is bile dependent). She will, of course, have to have her coags corrected prior to undergoing any interventions.  My partner, Dr. Excell Seltzerooper, will begin seeing her tomorrow and continue with her evaluation and possible need for surgical intervention.  ____________________________ Claude MangesWilliam F. Delecia Vastine, MD wfm:ap D: 08/09/2011 15:42:50 ET              T: 08/09/2011 16:16:24 ET                 JOB#: [id number] cc: Claude MangesWilliam F. Aldine Chakraborty, MD, <Dictator> Duanne Limerickeanna C. Jones, MD Claude MangesWILLIAM F Xzavian Semmel MD ELECTRONICALLY SIGNED 08/09/2011 17:02

## 2014-10-03 NOTE — Consult Note (Signed)
Brief Consult Note: Diagnosis: Admission for fever with known history jaundice.  Newly diagnosed with pancreatic cancer.  Abnormal abdominal ultrasound findings today of gallstones, but known history.  ERCP during last admission with stent placement.  Patient was seen and evaluated by Dr. Lutricia FeilPaul Oh as well and do not feel fever is in relationship at this time due to recent stent being placed.  Feel fever probable in relationship to evidence of UTI.  Gross amount of WBC noted on urine results.   Consult note dictated.   Discussed with Attending MD.   Comments: At time time recommendation is to proceed with antibiotic treatment as prescribed for management of UTI as well as this will cover if possible infectious process in correlation with stent placement.  If patient' condidtion does not continue to improve then will proceed with removal current stent and placement of larger stent to be placed.  Will monitor laboratory studies. Continue IV hydration.  Electronic Signatures: Rodman KeyHarrison, Coralee Edberg S (NP)  (Signed 12-Apr-13 13:01)  Authored: Brief Consult Note   Last Updated: 12-Apr-13 13:01 by Rodman KeyHarrison, Bobak Oguinn S (NP)

## 2014-10-03 NOTE — Consult Note (Signed)
Chief Complaint:   Subjective/Chief Complaint Pt feels ok. No abd pain. No fever. Poor appetite. LFT continues to improve post biliary stenting.   VITAL SIGNS/ANCILLARY NOTES: **Vital Signs.:   17-Apr-13 11:32   Vital Signs Type POCT   Nurse Fingerstick (mg/dL) FSBS (fasting range 65-99 mg/dL) 286   Comments/Interventions  Nurse Notified   Brief Assessment:   Cardiac Regular    Respiratory clear BS    Gastrointestinal Normal   Routine Hem:  17-Apr-13 06:05    WBC (CBC) 9.8   RBC (CBC) 3.04   Hemoglobin (CBC) 8.0   Hematocrit (CBC) 25.5   Platelet Count (CBC) 301   MCV 84   MCH 26.2   MCHC 31.2   RDW 19.3   Neutrophil % 69.7   Lymphocyte % 8.6   Monocyte % 15.0   Eosinophil % 5.0   Basophil % 1.7   Neutrophil # 6.8   Lymphocyte # 0.8   Monocyte # 1.5   Eosinophil # 0.5   Basophil # 0.2  Routine Chem:  17-Apr-13 06:05    Glucose, Serum 270   BUN 26   Creatinine (comp) 1.23   Sodium, Serum 129   Potassium, Serum 4.3   Chloride, Serum 97   CO2, Serum 20   Calcium (Total), Serum 8.4  Hepatic:  17-Apr-13 06:05    Bilirubin, Total 5.8   Alkaline Phosphatase 405   SGPT (ALT) 26   SGOT (AST) 15   Total Protein, Serum 6.8   Albumin, Serum 2.3  Routine Chem:  17-Apr-13 06:05    Osmolality (calc) 273   eGFR (African American) 50   eGFR (Non-African American) 43   Anion Gap 12   Assessment/Plan:  Assessment/Plan:   Assessment UTI/cholangitis.    Plan Continue with cipro plus treating UTI for few more days if necessary. Will sign off. Have patient f/u in office in few weeks. If no further intervention planned for her pancreatic cancer, then will consider metal biliary stenting next time. Thanks.   Electronic Signatures: Verdie Shire (MD)  (Signed 17-Apr-13 13:33)  Authored: Chief Complaint, VITAL SIGNS/ANCILLARY NOTES, Brief Assessment, Lab Results, Assessment/Plan   Last Updated: 17-Apr-13 13:33 by Verdie Shire (MD)

## 2014-10-03 NOTE — Discharge Summary (Signed)
PATIENT NAME:  Christy Wilson, Christy Wilson MR#:  [id number] DATE OF BIRTH:  1935/01/12  DATE OF ADMISSION:  08/09/2011 DATE OF DISCHARGE:  08/16/2011  ADMITTING DIAGNOSIS: Jaundice as well as itching.   DISCHARGE DIAGNOSES:  1. Painless obstructive jaundice due to common bile duct stricture status post two ERCPs with sphincterectomy and dilation. Now her bilirubin is decreasing. Also has a slightly elevated CA19-9 with CT of the abdomen showing fullness in the pancreas. The patient will need outpatient EUS for further evaluation to rule out malignancy. The patient's cytology from the brushings were negative for pathology.  2. Post procedure respiratory failure possibly due to acute congestive heart failure as well as chronic obstructive pulmonary disease exacerbation. The patient now extubated on room air.  3. Coagulopathy as a result of Coumadin therapy, status post vitamin K and FFP.  4. Cholelithiasis with associated thickening of the gallbladder but Murphy's sign was negative. Was treated with antibiotics empirically. Has been seen by surgery. There is no indication for acute cholecystectomy.  5. Chronic obstructive pulmonary disease with acute exacerbation during hospitalization, now breathing back to normal.  6. Hypertension. Antihypertensives were held during hospitalization due to hypotension.  7. Diabetes, poorly controlled.  8. Anemia likely due to anemia of chronic disease.  9. Morbid obesity.  10. Status post left THR.  11. Status post hysterectomy.  12. Chronic atrial fibrillation, was on anticoagulation. Coumadin has been held due to planned EUS study. May need biopsy/ further procedures. We will hold off on Coumadin until seen by GI as an outpatient.   CONSULTANTS:  1. Dr. Bluford Kaufmann 2. Dr. Excell Seltzer  3. Dr. Belia Heman   PERTINENT LABORATORY DATA AND EVALUATIONS: Basic metabolic panel: Glucose 92, BUN 13, creatinine 0.69, sodium 138, potassium 3.5, chloride 101, CO2 24, anion gap 13, calcium 8.6, lipase  98. LFTs: Total protein 7.2, albumin 2.9, bilirubin total 19.9. Direct bili was 16.7. AST 53, ALT was 42. WBC 6.7, hemoglobin 9.1, platelet count 243, PT greater than 120. INR was 1.3. Urinalysis: Nitrites negative, leukocytes negative. CEA level was 4. CA19-9 was slightly elevated at 121. Most recent LFTs today showed a total protein of 6.4, albumin 2.5, bilirubin total 10.2, bilirubin direct is 8.5, alkaline phosphatase is 324, AST 22, ALT 32. The cytology report from the ERCP brushings showed normal cells, no malignancy. Echocardiogram of the heart showed ejection fraction of 35 to 45%, moderate mitral regurgitation.  Ultrasound on presentation showed cholelithiasis with there being associated thickening of the gallbladder wall and dilation of the common bile duct. There is dilated intrahepatic duct, increased echogenicity throughout the gallbladder most consistent with gallbladder sludge. CT scan of the abdomen showed slight heterogenicity of the pancreatic head without definite mass, may represent focal pancreatitis, but underlying mass cannot be excluded. ERCP showed common bile duct stricture with stent placed.   HOSPITAL COURSE: Please see History and Physical done by the admitting physician. The patient is a 80 year old African American female with history of congestive heart failure, hypertension, medical noncompliance, atrial fibrillation, and chronic obstructive pulmonary disease who presented to the hospital with itching as well as rash. The patient was noted to be jaundiced on presentation, confirmed by her LFTs. She was felt to have painless obstructive jaundice. She had right upper quadrant ultrasound which showed possible cholecystitis with dilated common bile ducts. The patient was placed on IV antibiotics. Surgery and GI consults were obtained. She on day two underwent ERCP.  Post ERCP the patient developed respiratory distress and had to be kept  on the ventilator.  She was able to be extubated.  She was treated for possible congestive heart failure exacerbation as well as chronic obstructive pulmonary disease exacerbation. The patient subsequently did okay in terms of her respiratory status.   PROBLEM LIST:  1. Painless obstructive jaundice: Initially she was thought to have possible cholecystitis with choledocholithiasis. However, further work-up revealed her to have painless obstructive jaundice. It  was not felt that she had acute cholecystitis. She underwent MRCP which showed stricture in the common bile duct so she had a stent placed in the common bile duct. However, even with the stent her bilirubin continued to climb so she had to have another ERCP followed by a smaller stent placed. Bilirubin and alkaline phosphatase have decreased since then. She did have a CA19-9 that was elevated. She has been followed by gastroenterology and surgery. She is planned to have outpatient EUS to further evaluate. She did have a CT scan of the abdomen which showed fullness in the pancreas without a discrete mass. The patient has been very anxious to go home so at this time she is being discharged.  She will have repeat LFTs next week as well as follow up with GI.  2. Acute respiratory failure post procedure: Felt to be due to a combination of possible chronic obstructive pulmonary disease exacerbation and acute systolic congestive heart failure. The patient was diuresed, given nebulizers and steroids with resolution of her respiratory failure. Currently she is on room air.  3. The patient was a little hypotensive: Her antihypertensives were held. We will continue to hold her ACE inhibitor. Resume Cardizem as taking previously as well as Lasix. At this time, the patient is stable for discharge and has been cleared by gastroenterology for discharge with outpatient followup.   DISCHARGE MEDICATIONS:  1. Glyburide/metformin 5/500, 2 tabs Wilson.i.d.  2. Carvedilol 6.25, 1 tab p.o. Wilson.i.d.  3. KCl 20 mEq p.o.  daily. 4. Spiriva 18 mcg inhalation daily. 5. Diltiazem HCR? 300 mg daily.  6. Spironolactone 25 mg daily.  7. Advair 250/50 1 puff Wilson.i.d.  8. Nexium 40 daily.  9. ProAir HFA 1 puff daily as needed.  10. Lantus 36 units at bedtime.  11. Lasix 40 mg 1 tab p.o. Wilson.i.d.  Patient is told not to take the Coumadin for further procedures. Also do not take Benicar until seen by primary M.D.   DIET: Low sodium ADA diet.   ACTIVITY: As tolerated.   FOLLOWUP:  1. Follow up with Dr. Bluford Kaufmannh next week.  2. Follow up with Dr. Elizabeth Sauereanna Jones.  Liver function tests at the time of the visit.  3. Follow up with Dr. Excell Seltzerooper for evaluation for need for possible cholecystectomy.        TIME SPENT:  35 minutes.   ____________________________ Lacie ScottsShreyang H. Allena KatzPatel, MD shp:bjt D: 08/16/2011 15:00:53 ET T: 08/16/2011 15:38:34 ET JOB#: [id number]  cc: Lashika Erker H. Allena KatzPatel, MD, <Dictator> Duanne Limerickeanna C. Jones, MD Charise CarwinSHREYANG H Kaede Clendenen MD ELECTRONICALLY SIGNED 08/17/2011 7:56

## 2014-10-03 NOTE — Op Note (Signed)
PATIENT NAME:  Christy Wilson, Christy Wilson MR#:  [id number] DATE OF BIRTH:  Mar 30, 1935  DATE OF PROCEDURE:  10/08/2011  PREOPERATIVE DIAGNOSIS: Pancreatic cancer with poor venous access.   POSTOPERATIVE DIAGNOSIS: Pancreatic cancer with poor venous access.   PROCEDURES:  1. Ultrasound guidance for vascular access, right jugular vein.  2. Fluoroscopic guidance for placement of catheter.  3. Placement of a CT-compatible Infuse-a-Port, right jugular vein.   SURGEON: Christy BarrenJason Kemora Pinard, MD    ANESTHESIA:  Local with moderate conscious sedation.  ESTIMATED BLOOD LOSS: Approximately 25 mL.   INDICATION FOR PROCEDURE: The patient is a 80 year old African American female with pancreatic cancer. She needs a Port-A-Cath for chemotherapy and venous access. Risks and benefits were discussed. Informed consent was obtained.   DESCRIPTION OF PROCEDURE: The patient was brought to the vascular interventional radiology suite. The right neck and chest were sterilely prepped and draped, and a sterile surgical field was created. The right jugular vein was visualized and found to be widely patent. It was then accessed under direct ultrasound guidance without difficulty with a Seldinger needle and a permanent image was recorded. J-wire was placed. After skin nick and dilatation the peel-away sheath was placed over the wire. I then anesthetized an area two fingerbreadths below the right clavicle. A transverse incision was created. An inferior pocket was created off this incision to house the port. The port was then connected to the catheter, tunneled from the subclavicular incision to the access site. It was secured into the pocket with 2 Prolene sutures and fluoroscopic guidance was used to cut the catheter to an appropriate length. It was then placed through the peel-away sheath and the peel-away sheath was removed. The catheter tip was placed in excellent location in the superior vena cava just shy of the right atrium without a kink. The  wounds were irrigated with antibiotic-impregnated saline. There subclavicular incision was closed with running 3-0 Vicryl and 4-0 Monocryl. The access incision was closed with a single 4-0 Monocryl. The Con[place]er needle was used to access the port. It withdrew blood well and flushed easily with heparinized saline and then the dressing was placed in the form of Dermabond. The patient tolerated the procedure well and was taken to the recovery room in stable condition.     ____________________________ Annice NeedyJason S. Cabrini Ruggieri, MD jsd:bjt D: 10/08/2011 11:38:20 ET T: 10/08/2011 12:09:03 ET JOB#: [id number]  cc: Annice NeedyJason S. Javarri Segal, MD, <Dictator> Annice NeedyJASON S Lariyah Shetterly MD ELECTRONICALLY SIGNED 10/08/2011 16:38

## 2014-10-03 NOTE — Consult Note (Signed)
Brief Consult Note: Diagnosis: Acute onset of Jaundice, painless.  Known history of Diabetes mellitus.  Overcoagulability state.  Had been receiving Warfarin for treatment of atrial fibrillation.  Abnormal GI findings of dilated common bile duct as well as intrahepatic ductal dilation.  Presentation of obstructive process.   Consult note dictated.   Discussed with Attending MD.   Comments: Patient's presentation discussed with Dr. Lutricia FeilPaul Oh.  Would like to proceed with ERCP tomorrow but concern that overcoagulability state may not be corrected.  Will continue to monitor.  PT/INR in am.  Remain on clear liquid diet and antibiotic therapy.  Electronic Signatures: Rodman KeyHarrison, Lorali Khamis S (NP)  (Signed [phone]-Feb-13 18:59)  Authored: Brief Consult Note   Last Updated: 28-Feb-13 18:59 by Rodman KeyHarrison, Janeen Watson S (NP)

## 2014-10-03 NOTE — Consult Note (Signed)
PATIENT NAME:  Christy Wilson, Christy Wilson MR#:  [id number] DATE OF BIRTH:  [birth year]  DATE OF CONSULTATION:  09/21/2011  REFERRING PHYSICIAN:  Forest Gleason, MD CONSULTING PHYSICIAN:  Payton Emerald, NP/Paul Oh, MD  REASON FOR CONSULTATION:  Fever and jaundice.    HISTORY OF PRESENT ILLNESS: Ms. Mcadams is a 80 year old African American female who is well known to myself as well as Dr. Verdie Shire. She has recently underwent ERCP with pancreatic stent placement as she had an endoscopic ultrasound done on 09/06/2011 which revealed a diffuse biliary stricture being found. Biopsy results revealed to be positive for adenocarcinoma. The patient was seen by Dr. Oliva Bustard yesterday. She was nauseated at the time of our office visit, as well as fever. According to niece, who is present during interviewing process, was up to 101. Dr. Metro Kung office notes from yesterday reveals though a temperature of 99.1. Significant for anorexia at home. She was not experiencing nausea, though until yesterday, no vomiting. No abdominal pain, questionable amount of weight loss at this time. Last bowel movement was yesterday. No rectal bleeding. No melena. Given her presentation yesterday in the outpatient setting, a decision was made for the patient to be admitted to undergo further evaluation as there was concern of possibly biliary obstruction, as well as an infectious process occurring. Laboratory evaluation though did not reveal total bilirubin to be more elevated though than it was at the time of her most recent hospitalization prior to this. Total bilirubin was 6.5, alkaline phosphatase 489 with an AST of 52 and an ALT of 38. During her previous hospitalization, bilirubin was elevated up to a level of 28.2 with an alkaline phosphatase 483 and an AST of 38 on 08/13/2011. On 08/11/2011, direct bilirubin was elevated at 21.3 with a total of 23.7.   HOME MEDICATIONS:  1. Advair Diskus 250 mcg/50 mcg inhalation 1 inhalation twice daily.   2. Carvedilol 6.25 mg one tablet twice daily.  3. Diltiazem/hydrochloride ER 300 mg 1 capsule daily. 4. Furosemide 40 mg one twice a day. 5. Glyburide/metformin 5/500 mg 2 tablets 2 times a day.  6. Lantus 100 units per mL 36 units subcutaneous once a day. 7. Lisinopril 20 mg once a day. 8. Nexium 40 mg once daily. 9. Potassium chloride 20 mEq 1 tablet once a day. 10. ProAir 1 puff once a day as directed as needed. 11. Setan Plus One twice a day.  12. Spiriva 18 mcg inhalation 1 inhalation once a day as needed.  13. Spironolactone 25 mg one tablet once a day. 14. Tylenol extra strength 1 tablet as directed as needed.   ALLERGIES: Penicillin.   PAST MEDICAL HISTORY:  1. Congestive heart failure, left heart, chronic diastolic.  2. Hypertension.  3. Nephropathy. 4. Atrial fibrillation followed by Roderic Palau, NP. 5. History of chronic obstructive pulmonary disease with bronchitis April 2012.  6. Diabetes mellitus, insulin-dependent.  7. Newly diagnosed pancreatic cancer 08/2011.   PAST SURGICAL HISTORY:  Hysterectomy.   FAMILY HISTORY: Mother with history of bowel blockage at the age of 34. Father deceased at the age of 77, unknown cause. Multiple siblings. One brother with a history of myocardial infarction.   SOCIAL HISTORY: Divorced. No children. Retired from [employer]. Close to her niece. Only smoked as a teenager for a short period of time. Was drinking wine or beer once a week prior to her hospitalization in February. Very minimal caffeine.   REVIEW OF SYSTEMS: All systems reviewed and checked, otherwise unremarkable other than what  is stated above.   PHYSICAL EXAMINATION:  VITAL SIGNS: Temperature 98.2, pulse 114, respirations 20, blood pressure 114/75, pulse oximetry 97% on room air.   GENERAL: Well-developed, well nourished 80 year old [place] American female who appears not to be feeling well. Very tired, fatigued feeling which she does state is a concern, resting  comfortably in bed. Niece at bedside.   HEENT: Normocephalic, atraumatic. Pupils equal, reactive to light. Conjunctivae clear. Sclerae anicteric.   NECK: Supple. Trachea midline. No lymphadenopathy or thyromegaly.   PULMONARY: Symmetric rise and fall of chest. Clear to auscultation throughout.   CARDIOVASCULAR: S1, S2. Irregular. No murmurs, no gallops.   ABDOMEN: Soft, nondistended. Bowel sounds in four quadrants. No bruits. No masses. Nontender.   RECTAL: Deferred.   MUSCULOSKELETAL: Moving all four extremities. No contractures. No clubbing.   EXTREMITIES: No edema.   PSYCH: Alert and oriented x4. Memory grossly intact. Appropriate affect and mood.   NEUROLOGICAL: No gross neurological deficits.   LABORATORY, DIAGNOSTIC, AND RADIOLOGICAL DATA ON ADMISSION: Chemistry panel: Glucose 254, creatinine 0.94, sodium 132, chloride 96. EGFR African American greater than 60. Hepatic panel: Albumin 3.1. Total bilirubin 6.5, alkaline phosphatase 489, AST 52. ALT 38. CBC: RBC 3.28, hemoglobin 8.6 which is an improvement after being discharged which was noted at 7.7 during office visit. Hematocrit 27.9, MCHC is 30.9, RDW is 20.2. Neutrophil number was elevated to 8.0. Lymphocyte number decreased to 0.2 and monocyte number elevated at 1.8. Blood cultures x2: No growth in 18 to 24 hours. Urinalysis revealed +1 bilirubin, +1 blood, protein 30 mg/dL, nitrates are negative, but leukocytes 3+. RBC 8 per high-power field, WBCs are 3,746 per high-power field and 2+ bacteria, mucous present as well as Liberatore blood cell clumps. Chest PA and lateral: Small right pleural effusion, stable cardiomegaly. Abdominal ultrasound was done this morning for the indication of jaundice. Findings: Body of the pancreas appeared normal. Head and tail are indistinct and not visualized adequately for evaluation, observed dilated intrahepatic ducts. A biliary stent is present. Upper abdominal aorta and inferior vena cava were visualized  and appeared normal in appearance. Noted echodensities in the gallbladder compatible with gallstones. Nonshadowing echogenic material compatible with sludge. There is no thickening of the gallbladder wall measuring 2.1 mm in thickness. Common bile duct does contain a stent. It is dilated and measures 13 mm in diameter. The kidneys show no hydronephrosis. Right kidney measures 12.8 cm and left is 12.4 and noted a trace of fluid along the inferior margin of the liver.   IMPRESSION/RECOMMENDATIONS: A 80 year old African American female who presented to the hospital for admission from Dr. Metro Kung office for the concern of high fever up to 101 status post ERCP with stent placement. Concern a possible obstructive process occurring though her total bilirubin is still much improved from her hospitalization in February. Noted evidence of urinary tract infection, gross amount of Fujikawa blood cells noted. Very poor p.o. intake at home, probable contributory factor. The patient was also seen by Dr. Verdie Shire and concurs that do not feel at this time that there is evidence of obstruction to the stent and that the fever is in correlation with having had a recent ERCP. He feels that she needs to be first treated for a urinary tract infection. If urinary tract infection does not improve or the patient's condition does decline as we will continue to monitor laboratory studies, then possibly during admission over the weekend if warranted or on Monday will consider proceeding with ERCP and placement of stent  which is  present with placement of a larger stent.   This was discussed with patient as well as her niece who was present during interviewing process. Continue with antibiotic therapy as scheduled.   These services provided by Payton Emerald, NP in collaboration with Verdie Shire, MD.    ____________________________ Payton Emerald, NP dsh:ap D: 09/21/2011 77:41:28 ET T: 09/21/2011 16:43:46 ET JOB#: [id number]  cc: Payton Emerald, NP, <Dictator> Payton Emerald MD ELECTRONICALLY SIGNED 09/21/2011 18:00
